# Patient Record
Sex: Female | Born: 1980 | Race: Black or African American | Hispanic: No | Marital: Single | Smoking: Never smoker
Health system: Southern US, Community
[De-identification: ages and names within clinical notes are randomized; demographics above are authoritative.]

## PROBLEM LIST (undated history)

## (undated) DIAGNOSIS — Z789 Other specified health status: Secondary | ICD-10-CM

## (undated) HISTORY — PX: NO PAST SURGERIES: SHX2092

---

## 2014-03-28 ENCOUNTER — Emergency Department: Payer: Self-pay | Admitting: Emergency Medicine

## 2015-02-09 ENCOUNTER — Emergency Department
Admission: EM | Admit: 2015-02-09 | Discharge: 2015-02-09 | Disposition: A | Discharge status: Home / Self Care | Payer: BLUE CROSS/BLUE SHIELD | Attending: Emergency Medicine | Admitting: Emergency Medicine

## 2015-02-09 ENCOUNTER — Emergency Department: Payer: BLUE CROSS/BLUE SHIELD

## 2015-02-09 ENCOUNTER — Encounter: Payer: Self-pay | Admitting: *Deleted

## 2015-02-09 DIAGNOSIS — N939 Abnormal uterine and vaginal bleeding, unspecified: Secondary | ICD-10-CM

## 2015-02-09 DIAGNOSIS — Z3A00 Weeks of gestation of pregnancy not specified: Secondary | ICD-10-CM | POA: Diagnosis not present

## 2015-02-09 DIAGNOSIS — O200 Threatened abortion: Secondary | ICD-10-CM

## 2015-02-09 DIAGNOSIS — E876 Hypokalemia: Secondary | ICD-10-CM

## 2015-02-09 DIAGNOSIS — O99280 Endocrine, nutritional and metabolic diseases complicating pregnancy, unspecified trimester: Secondary | ICD-10-CM | POA: Diagnosis not present

## 2015-02-09 DIAGNOSIS — O9989 Other specified diseases and conditions complicating pregnancy, childbirth and the puerperium: Secondary | ICD-10-CM | POA: Diagnosis present

## 2015-02-09 DIAGNOSIS — E871 Hypo-osmolality and hyponatremia: Secondary | ICD-10-CM

## 2015-02-09 LAB — URINALYSIS COMPLETE WITH MICROSCOPIC (ARMC ONLY)
Bilirubin Urine: NEGATIVE
Glucose, UA: NEGATIVE mg/dL
Hgb urine dipstick: NEGATIVE
Ketones, ur: NEGATIVE mg/dL
Leukocytes, UA: NEGATIVE
Nitrite: NEGATIVE
PH: 6 (ref 5.0–8.0)
PROTEIN: NEGATIVE mg/dL
Specific Gravity, Urine: 1.004 — ABNORMAL LOW (ref 1.005–1.030)

## 2015-02-09 LAB — CBC
HCT: 37.1 % (ref 35.0–47.0)
Hemoglobin: 12 g/dL (ref 12.0–16.0)
MCH: 27.2 pg (ref 26.0–34.0)
MCHC: 32.3 g/dL (ref 32.0–36.0)
MCV: 84 fL (ref 80.0–100.0)
PLATELETS: 251 10*3/uL (ref 150–440)
RBC: 4.42 MIL/uL (ref 3.80–5.20)
RDW: 13 % (ref 11.5–14.5)
WBC: 8.4 10*3/uL (ref 3.6–11.0)

## 2015-02-09 LAB — TYPE AND SCREEN
ABO/RH(D): A POS
Antibody Screen: NEGATIVE

## 2015-02-09 LAB — BASIC METABOLIC PANEL
Anion gap: 6 (ref 5–15)
BUN: 6 mg/dL (ref 6–20)
CO2: 22 mmol/L (ref 22–32)
Calcium: 8.8 mg/dL — ABNORMAL LOW (ref 8.9–10.3)
Chloride: 104 mmol/L (ref 101–111)
Creatinine, Ser: 0.62 mg/dL (ref 0.44–1.00)
GFR calc Af Amer: >60 mL/min (ref >60)
GLUCOSE: 92 mg/dL (ref 65–99)
POTASSIUM: 3.3 mmol/L — AB (ref 3.5–5.1)
Sodium: 132 mmol/L — ABNORMAL LOW (ref 135–145)

## 2015-02-09 LAB — CHLAMYDIA/NGC RT PCR (ARMC ONLY)
Chlamydia Tr: NOT DETECTED
N GONORRHOEAE: NOT DETECTED

## 2015-02-09 LAB — WET PREP, GENITAL
Clue Cells Wet Prep HPF POC: NONE SEEN
SPERM: NONE SEEN
TRICH WET PREP: NONE SEEN
YEAST WET PREP: NONE SEEN

## 2015-02-09 LAB — POCT PREGNANCY, URINE: Preg Test, Ur: POSITIVE — AB

## 2015-02-09 LAB — HCG, QUANTITATIVE, PREGNANCY: hCG, Beta Chain, Quant, S: 733 m[IU]/mL — ABNORMAL HIGH (ref <5)

## 2015-02-09 LAB — ABO/RH: ABO/RH(D): A POS

## 2015-02-09 MED ORDER — SODIUM CHLORIDE 0.9 % IV BOLUS (SEPSIS): 1000.0000 mL | Freq: Once | INTRAVENOUS | Status: DC

## 2015-02-09 MED ORDER — SODIUM CHLORIDE 0.9 % IV BOLUS (SEPSIS)
1000.0000 mL | Freq: Once | INTRAVENOUS | Status: AC
  Administered 2015-02-09: 1000 mL via INTRAVENOUS

## 2015-02-09 MED ORDER — POTASSIUM CHLORIDE CRYS ER 20 MEQ PO TBCR
40.0000 meq | EXTENDED_RELEASE_TABLET | Freq: Once | ORAL | Status: AC
  Administered 2015-02-09: 40 meq via ORAL
  Filled 2015-02-09: qty 2

## 2015-02-09 MED ORDER — IBUPROFEN 800 MG PO TABS
800.0000 mg | ORAL_TABLET | Freq: Once | ORAL | Status: AC
  Administered 2015-02-09: 800 mg via ORAL
  Filled 2015-02-09: qty 1

## 2015-02-09 NOTE — Discharge Instructions (Signed)
May take Tylenol for pain but do not take any NSAID medications including Aleve, Motrin, Advil, or ibuprofen.  Please call the OB/GYN appointment at 8 AM tomorrow morning to schedule an appointment to be seen tomorrow. It is very important for you to be seen before the weekend.  Please return to the emergency department if you develop severe pain, lightheadedness or shortness of breath, fainting, fever, or any other symptoms concerning to you.

## 2015-02-09 NOTE — ED Provider Notes (Signed)
[facility] Emergency Department Provider Note  ____________________________________________  Time seen: Approximately 6:48 PM  I have reviewed the triage vital signs and the nursing notes.   HISTORY  Chief Complaint Abdominal Pain    HPI Sierra Cummings is a 35 y.o. female G2 A1 sent from clinic for positive pregnancy with suprapubic cramping. Patient reports last LMP 01/07/2015. When her next menstrual cycle was due, she had 2 days of very light spotting. Today she developed some lower abdominal cramping and had a positive pregnancy test area did she denies any lightheadedness or shortness of breath, no syncope. No fever. No change in vaginal discharge or urinary symptoms. Pt is sexually active, not using birth control.  PMH: No hx of treatment for STD   History reviewed. No pertinent past medical history.  There are no active problems to display for this patient.   No past surgical history on file.  No current outpatient prescriptions on file.  Allergies Bactrim  History reviewed. No pertinent family history.  Social History Social History  Substance Use Topics  . Smoking status: None  . Smokeless tobacco: None  . Alcohol Use: None    Review of Systems Constitutional: No fever/chills. No lightheadedness or sob. Eyes: No visual changes. ENT: No sore throat. Cardiovascular: Denies chest pain, palpitations. Respiratory: Denies shortness of breath.  No cough. Gastrointestinal: Postive lower abdominal cramping.  No nausea, no vomiting.  No diarrhea.  No constipation. Genitourinary: Negative for dysuria. No change in vaginal discharge. Musculoskeletal: Negative for back pain. Skin: Negative for rash. Neurological: Negative for headaches, focal weakness or numbness.  10-point ROS otherwise negative.  ____________________________________________   PHYSICAL EXAM:  VITAL SIGNS: ED Triage Vitals  Enc Vitals Group     BP 02/09/15 1724  117/74 mmHg     Pulse Rate 02/09/15 1724 92     Resp 02/09/15 1724 18     Temp 02/09/15 1724 98.9 F (37.2 C)     Temp Source 02/09/15 1724 Oral     SpO2 02/09/15 1724 96 %     Weight 02/09/15 1724 129 lb (58.514 kg)     Height 02/09/15 1724  (1.651 m)     Head Cir --      Peak Flow --      Pain Score 02/09/15 1724 4     Pain Loc --      Pain Edu? --      Excl. in GC? --     Constitutional: Alert and oriented. Well appearing and in no acute distress. Answer question appropriately. Eyes: Conjunctivae are normal.  EOMI. scleral icterus. Head: Atraumatic. Nose: No congestion/rhinnorhea. Mouth/Throat: Mucous membranes are moist.  Neck: No stridor.  Supple.   Cardiovascular: Normal rate, regular rhythm. No murmurs, rubs or gallops.  Respiratory: Normal respiratory effort.  No retractions. Lungs CTAB.  No wheezes, rales or ronchi. Gastrointestinal: Soft and nondistended. Minimal tenderness in the suprapubic region. No peritoneal signs, guarding or rebound.. Genitourinary: Normal-appearing external genitalia without lesions. Normal vaginal exam with brown and white thick discharge, normal-appearing cervix, normal vaginal wall tissue. Bimanual exam is negative for CMT, adnexal tenderness to palpation, no palpable masses. Cervix is closed. Musculoskeletal: No LE edema.  Neurologic:  Normal speech and language. No gross focal neurologic deficits are appreciated.  Skin:  Skin is warm, dry and intact. No rash noted. Psychiatric: Mood and affect are normal. Speech and behavior are normal.  Normal judgement.  ____________________________________________   LABS (all labs ordered are listed, but  only abnormal results are displayed)  Labs Reviewed  HCG, QUANTITATIVE, PREGNANCY - Abnormal; Notable for the following:    hCG, Beta Chain, Quant, S 733 (*)    All other components within normal limits  POCT PREGNANCY, URINE - Abnormal; Notable for the following:    Preg Test, Ur POSITIVE (*)     All other components within normal limits  CHLAMYDIA/NGC RT PCR ([facility] ONLY)  WET PREP, GENITAL  CBC  BASIC METABOLIC PANEL  POC URINE PREG, ED  TYPE AND SCREEN   ____________________________________________  EKG  Not Indicated ____________________________________________  RADIOLOGY  No results found.  ____________________________________________   PROCEDURES  Procedure(s) performed: None  Critical Care performed: No ____________________________________________   INITIAL IMPRESSION / ASSESSMENT AND PLAN / ED COURSE  Pertinent labs & imaging results that were available during my care of the patient were reviewed by me and considered in my medical decision making (see chart for details).  35 y.o. G2P1 pregnant female w/ lower abd cramping; has not had bleeding for several weeks.  Plan to eval for suspected ab versus ectopic.  Consider STI, r/o UTI.  R/o anemia.  ----------------------------------------- 8:36 PM on 02/09/2015 -----------------------------------------  Patient continues to be pain-free. Her ultrasound does not show any intrauterine pregnancy, so at this point, she is still possible threatened AB versus ectopic versus early pregnancy. I would do a pelvic exam to send cultures, and then talk to OB/GYN on call's she can have close follow-up. Patient understands her follow-up plan as well as return precautions.  ----------------------------------------- 9:23 PM on 02/09/2015 -----------------------------------------  I have spoken with Dr. Tiburcio Pea, who will see the patient in clinic tomorrow to get her set up for repeat hCG on Saturday. Patient understands return precautions and follow-up instructions, plan discharge.  ____________________________________________  FINAL CLINICAL IMPRESSION(S) / ED DIAGNOSES  Final diagnoses:  Vaginal bleeding      NEW MEDICATIONS STARTED DURING THIS VISIT:  New Prescriptions   No medications on file      Rockne Menghini, MD 02/09/15 2123

## 2015-02-09 NOTE — ED Notes (Signed)
Pt states she had a missed period and then had a positive pregnancy test, now states lower abd cramping, denies any vaginal bleeding or discharge

## 2015-02-10 ENCOUNTER — Emergency Department
Admission: EM | Admit: 2015-02-10 | Discharge: 2015-02-10 | Disposition: A | Discharge status: Home / Self Care | Payer: BLUE CROSS/BLUE SHIELD | Source: Home / Self Care | Attending: Emergency Medicine | Admitting: Emergency Medicine

## 2015-02-10 ENCOUNTER — Emergency Department
Admission: EM | Admit: 2015-02-10 | Discharge: 2015-02-10 | Disposition: A | Discharge status: Home / Self Care | Payer: BLUE CROSS/BLUE SHIELD | Attending: Emergency Medicine | Admitting: Emergency Medicine

## 2015-02-10 ENCOUNTER — Inpatient Hospital Stay: Admit: 2015-02-10 | Payer: BLUE CROSS/BLUE SHIELD | Source: Ambulatory Visit | Admitting: Obstetrics and Gynecology

## 2015-02-10 ENCOUNTER — Encounter: Payer: Self-pay | Admitting: *Deleted

## 2015-02-10 ENCOUNTER — Encounter: Payer: Self-pay | Admitting: Emergency Medicine

## 2015-02-10 ENCOUNTER — Telehealth: Payer: Self-pay | Admitting: Obstetrics and Gynecology

## 2015-02-10 DIAGNOSIS — O209 Hemorrhage in early pregnancy, unspecified: Secondary | ICD-10-CM | POA: Insufficient documentation

## 2015-02-10 DIAGNOSIS — O200 Threatened abortion: Secondary | ICD-10-CM

## 2015-02-10 DIAGNOSIS — Z3A01 Less than 8 weeks gestation of pregnancy: Secondary | ICD-10-CM

## 2015-02-10 LAB — HCG, QUANTITATIVE, PREGNANCY: hCG, Beta Chain, Quant, S: 776 m[IU]/mL — ABNORMAL HIGH

## 2015-02-10 NOTE — ED Notes (Signed)
Pt is [redacted] weeks pregnant, spotting blood today, pt reports slight abdominal cramping

## 2015-02-10 NOTE — Telephone Encounter (Signed)
GYN Telephone Note  I was paged by the ER about Ms. [place] on my way to urgent delivery, due to the fact that she presented because she states that the office said they [facility] couldn't see her until March for ER follow up; she was initially phone triaged by Dr. Tiburcio Pea, who said she could follow up today, January 27th, per the ER notes I said that was unusual and patient can usually be seen in the week. Regardless, the ER stated that she was stable and fine for discharge, and I said for her to call the office on Monday.    Now, on initial viewing of her, even though there isn't data out there for a 21 hour beta, it seems low to me since it only went up about 5% (733 on 1/26 @ 1726 to 776 on 1/27 @ 1412). She is A pos. Her initial ER note on 1/26 states that she presented from a clinic with a +UPT and suprapubic cramping, with brownish discharge on pelvic exam and today she states that she has lower abdominal cramping and vaginal spotting. Prior GC/CT and wet prep testing were negative, along with a u/a and bmp (K slightly low at 3.3)  I called her phone at [phone] and left a VM for her to page me back.  Cornelia Copa MD [facility]  Pager: [phone]

## 2015-02-10 NOTE — ED Provider Notes (Addendum)
[facility] Emergency Department Provider Note     Time seen: ----------------------------------------- 8:08 PM on 02/10/2015 -----------------------------------------    I have reviewed the triage vital signs and the nursing notes.   HISTORY  Chief Complaint No chief complaint on file.    HPI Sierra Cummings is a 35 y.o. female who presents ER with lower abdominal cramps and vaginal spotting. Patient states she's around [redacted] weeks pregnant and symptoms recurred. She was seen here yesterday and was told to follow-up with [facility] when she went there she was given an appointment for March.   History reviewed. No pertinent past medical history.  There are no active problems to display for this patient.   History reviewed. No pertinent past surgical history.  Allergies Bactrim  Social History Social History  Substance Use Topics  . Smoking status: Never Smoker   . Smokeless tobacco: None  . Alcohol Use: No    Review of Systems Constitutional: Negative for fever. Eyes: Negative for visual changes. ENT: Negative for sore throat. Cardiovascular: Negative for chest pain. Respiratory: Negative for shortness of breath. Gastrointestinal: Negative for abdominal pain, vomiting and diarrhea. Genitourinary: Negative for dysuria. Positive for vaginal bleeding and cramping Musculoskeletal: Negative for back pain. Skin: Negative for rash. Neurological: Negative for headaches, focal weakness or numbness.  10-point ROS otherwise negative.  ____________________________________________   PHYSICAL EXAM:  VITAL SIGNS: ED Triage Vitals  Enc Vitals Group     BP 02/10/15 1741 126/70 mmHg     Pulse Rate 02/10/15 1741 100     Resp 02/10/15 1741 14     Temp 02/10/15 1741 98.2 F (36.8 C)     Temp Source 02/10/15 1741 Oral     SpO2 02/10/15 1741 99 %     Weight 02/10/15 1741 129 lb (58.514 kg)     Height 02/10/15 1741  (1.651 m)     Head Cir  --      Peak Flow --      Pain Score 02/10/15 1741 4     Pain Loc --      Pain Edu? --      Excl. in GC? --     Constitutional: Alert and oriented. Well appearing and in no distress. Eyes: Conjunctivae are normal. PERRL. Normal extraocular movements. ENT   Head: Normocephalic and atraumatic.   Nose: No congestion/rhinnorhea.   Mouth/Throat: Mucous membranes are moist.   Neck: No stridor. Cardiovascular: Normal rate, regular rhythm. Normal and symmetric distal pulses are present in all extremities. No murmurs, rubs, or gallops. Respiratory: Normal respiratory effort without tachypnea nor retractions. Breath sounds are clear and equal bilaterally. No wheezes/rales/rhonchi. Gastrointestinal: Soft and nontender. No distention. No abdominal bruits.  Musculoskeletal: Nontender with normal range of motion in all extremities. No joint effusions.  No lower extremity tenderness nor edema. Neurologic:  Normal speech and language. No gross focal neurologic deficits are appreciated. Speech is normal. No gait instability. Skin:  Skin is warm, dry and intact. No rash noted. Psychiatric: Mood and affect are normal. Speech and behavior are normal. Patient exhibits appropriate insight and judgment.  ____________________________________________  ED COURSE:  Pertinent labs & imaging results that were available during my care of the patient were reviewed by me and considered in my medical decision making (see chart for details). Patient is no acute distress, I will discuss with the [facility] on call and recheck her Quant. ____________________________________________    LABS (pertinent positives/negatives) Quantitative beta hCG 776  ____________________________________________  FINAL  ASSESSMENT AND PLAN  Threatened miscarriage  Plan: Patient with labs and imaging as dictated above. HCG is not increasing as expected. Have discussed with [facility] Dr. Vergie Living. Patient is  advised to call the office on Monday and she will be able to be seen sooner. This point she has no significant pain or tenderness and is stable for outpatient follow-up.   Emily Filbert, MD Patient subsequent he stated she does not want to see [facility] because she had a bad experience there today. I will give her alternative follow-up information.  Emily Filbert, MD 02/10/15 2011  Emily Filbert, MD 02/10/15 Elisha Ponder

## 2015-02-10 NOTE — Telephone Encounter (Signed)
GYN Telephone Note  Patient called me back and situation d/w her and she is amenable for direct admission.   Cornelia Copa MD [facility]  Pager: [phone]

## 2015-02-10 NOTE — ED Notes (Signed)
Pt refuses blood to be taken

## 2015-02-10 NOTE — Discharge Instructions (Signed)

## 2015-02-10 NOTE — ED Notes (Signed)
Pt was seen for lower abdominal cramps yesterday here.  Had spotting yesterday then it stopped and came back to ED today because she started spotting again.  Still has some mild cramping.

## 2015-02-11 ENCOUNTER — Telehealth: Payer: Self-pay | Admitting: Obstetrics and Gynecology

## 2015-02-11 ENCOUNTER — Emergency Department
Admission: EM | Admit: 2015-02-11 | Discharge: 2015-02-11 | Disposition: A | Discharge status: Home / Self Care | Payer: BLUE CROSS/BLUE SHIELD | Attending: Emergency Medicine | Admitting: Emergency Medicine

## 2015-02-11 ENCOUNTER — Encounter: Payer: Self-pay | Admitting: Emergency Medicine

## 2015-02-11 DIAGNOSIS — O209 Hemorrhage in early pregnancy, unspecified: Secondary | ICD-10-CM | POA: Diagnosis not present

## 2015-02-11 DIAGNOSIS — Z3A01 Less than 8 weeks gestation of pregnancy: Secondary | ICD-10-CM | POA: Diagnosis not present

## 2015-02-11 DIAGNOSIS — O4690 Antepartum hemorrhage, unspecified, unspecified trimester: Secondary | ICD-10-CM | POA: Diagnosis present

## 2015-02-11 HISTORY — DX: Other specified health status: Z78.9

## 2015-02-11 LAB — CBC WITH DIFFERENTIAL/PLATELET
BASOS PCT: 1 %
Basophils Absolute: 0 10*3/uL (ref 0–0.1)
EOS ABS: 0.1 10*3/uL (ref 0–0.7)
EOS PCT: 2 %
HCT: 38.1 % (ref 35.0–47.0)
Hemoglobin: 12.4 g/dL (ref 12.0–16.0)
LYMPHS ABS: 1.9 10*3/uL (ref 1.0–3.6)
Lymphocytes Relative: 29 %
MCH: 26.8 pg (ref 26.0–34.0)
MCHC: 32.5 g/dL (ref 32.0–36.0)
MCV: 82.5 fL (ref 80.0–100.0)
Monocytes Absolute: 0.6 10*3/uL (ref 0.2–0.9)
Monocytes Relative: 9 %
Neutro Abs: 3.8 10*3/uL (ref 1.4–6.5)
Neutrophils Relative %: 59 %
PLATELETS: 270 10*3/uL (ref 150–440)
RBC: 4.62 MIL/uL (ref 3.80–5.20)
RDW: 13.4 % (ref 11.5–14.5)
WBC: 6.4 10*3/uL (ref 3.6–11.0)

## 2015-02-11 LAB — HCG, QUANTITATIVE, PREGNANCY: hCG, Beta Chain, Quant, S: 595 m[IU]/mL — ABNORMAL HIGH (ref <5)

## 2015-02-11 NOTE — ED Notes (Signed)
Had vaginal spotting on jan, 20, jan 26 and 27th. Has stopped. States was always light and never enough to wear a pad. Light abdominal cramping.

## 2015-02-11 NOTE — Telephone Encounter (Signed)
GYN Telephone Note  Patient called and VM left regarding how come she hasn't presented for direct admission and if she had any questions or concerns. Pt told to call the office to get the on call pager, and I will left my relief at [phone] (Dr. Jean Rosenthal) know about her situation.  Cornelia Copa MD [facility]  Pager: [phone]

## 2015-02-11 NOTE — ED Notes (Signed)
Pt is [redacted] weeks pregnant and has had 3 episodes of light spotting in the past. Today she had a very small amount that she said "must have happened overnight". Pt states she is currently not spotting.

## 2015-02-11 NOTE — Consult Note (Signed)
GYNECOLOGY CONSULT NOTE  GYN Consultation  Rajean Desantiago [phone] 02/11/2015 4:31 PM    Reason for Consultation:   Taraji Mungo is a 35 y.o. G1P0 female seen at the request of the Emergency Department for evaluation of vaginal bleeding in the setting of a positive pregnancy test.    History of Present Ilness:   The patient presents today to the ER in follow up from a telephone discussion she had with Dr. Vergie Living of [facility] yesterday evening.  Her story is as follows, her period was supposed to be around 02/03/15.  However, she missed her period. On Monday of this past week (1/24) she took a home pregnancy test which was faintly positive. On Tuesday she had a positive result. She presented to the walk-in clinic at [facility] and there had a positive pregnancy test.  She states that she was having no symptoms at that time.  But, was told to go to the ER to get an ultrasound.  The ED note from that date states that she was having some suprapubic cramping and a report of vaginal spotting.  She states that these symptoms did later develop.  She had an ultrasound that was essentially normal.  She had a quant hCG that was 733.  The ER MD that day discussed this with Dr. Tiburcio Pea who recommended she follow up the following day in clinic with him (Friday 1/27).  She was discharged.  She states that she was unable to get an appointment at [facility] and presented again yesterday for further evaluation when she began having more severe cramping.  She states this cramping was short lived, but she did have heavier spotting.  Her quant in the ER at that time was 776.  Dr Vergie Living from [facility] was paged, but was heading into a delivery and given that the patient was stable, he recommended the patient follow up in clinic on Monday to allow for enough time to detect an appropriate rise in her quantitative hCG.  Later, when he was able to review her chart, he saw that over about one day her quant had risen only a  small amount.  He called her at home and asked her to come to the hospital for direct admission at 5 am for further management.  According to her today, she states that she misunderstood Dr. Vergie Living to mean 5pm today, 02/11/15. This morning when Dr. Vergie Living noticed that she had not presented, called the patient and left her a voicemail to inquire as to why she had not shown up this morning. Once the patient heard this message she came to the hospital ED for admission.  She states that she has no pain or cramping now and has had no bleeding today, other than a small amount of dark discharge she noticed this morning on her panty liner she wore overnight.    Past Medical History  Diagnosis Date  . Medical history non-contributory    Past Surgical History  Procedure Laterality Date  . No past surgeries     Allergies  Allergen Reactions  . Bactrim [Sulfamethoxazole-Trimethoprim] Itching and Other (See Comments)    Patient states she gets red and little pimples appear.   Home Medications:  None   Obstetric History: She is a G1P0.    Gynecologic History:   Menarche age: 37 Patient's last menstrual period was 01/07/2015 (lmp unknown). Cycle Hx: flow is moderate and regular every 28 days without intermenstrual spotting Last Pap: 2-3, years ago, returned normal She denies  a history of abnormal pap smears  She denies a history of STDs. Contraception: None currently   Social History:  She  reports that she has never smoked. She does not have any smokeless tobacco history on file. She reports that she drinks alcohol. She reports that she does not use illicit drugs.  She is a Financial controller.  Family History:  family history is not on file.   Review of Systems:  Negative x 10 systems reviewed except as noted in the HPI.    Objective   BP 114/64 mmHg  Pulse 85  Temp(Src) 98.3 F (36.8 C) (Oral)  Resp 16  Ht  (1.651 m)  Wt 58.514 kg (129 lb)  BMI 21.47 kg/m2  SpO2 99%  LMP  01/07/2015 (LMP Unknown) Physical Exam  General:  She is a well appearing female in no distress.  HEENT:  Normocephalic, atraumatic.   Neck:  supple, no lymphadenopathy Cardiac:  RRR, no M/G/R Pulmonary:  Clear to auscultation bilaterally. No wheezes, rales, rhonchi.   Abdomen:  Soft, nontender, non-distended, +BS, no rebound, no guarding  Pelvic:  Deferred. Extremities:  Non-tender, symmetric no edema bilaterally.   Neurologic:  Alert & oriented x 3.  Appropriate, conversant.    Laboratory Results:   Recent Labs     02/09/15  1726  02/10/15  1412  02/11/15  1237  WBC  8.4   --   6.4  HGB  12.0   --   12.4  HCT  37.1   --   38.1  PLT  251   --   270  CREATININE  0.62   --    --   K  3.3*   --    --   NA  132*   --    --   HCGBETAQNT  733*  776*  595*   Imaging Results: Pelvic Ultrasound Report 02/09/2015 CLINICAL DATA: Lower abdominal pain and light vaginal bleeding. Estimated gestational age by last menstrual period equals for weeks 5 days. beta HCG equals 733.  EXAM: OBSTETRIC <14 WK [place] AND TRANSVAGINAL OB US  TECHNIQUE: Both transabdominal and transvaginal ultrasound examinations were performed for complete evaluation of the gestation as well as the maternal uterus, adnexal regions, and pelvic cul-de-sac. Transvaginal technique was performed to assess early pregnancy.  COMPARISON: None.  FINDINGS: Intrauterine gestational sac: None  Yolk sac: No  Embryo: No  Maternal uterus/adnexae: RIGHT ovaries normal size 2.3 x 1.2 by 1.8 cm. Normal small follicles. Small follicles within the LEFT ovary which measures 3.0 x 2.8 x 2.3 cm. Hypoechoic region in the LEFT ovary measuring 1.5 cm could represent a corpus luteal cyst.  No free-fluid.  IMPRESSION: No intrauterine gestational sac, yolk sac, or fetal pole identified. Differential considerations include intrauterine pregnancy too early to be sonographically visualized, missed abortion, or  ectopic pregnancy. Followup ultrasound is recommended in 10-14 days for further evaluation.  Assessment & Recommendations  Aanyah Gazzola is a 35 y.o. G1P0 female with a recent history of vaginal bleeding and a dropping quantitative hCG level.  She has no current symptoms.  Discussed, in great detail, the range of possible scenarios.  We specifically discussed that, no matter what the case, this is not a normal pregnancy.  We discussed the possibility of an ectopic pregnancy and what that means and the risks of an ectopic pregnancy.  We discussed that this could be a spontaneous abortion that is evolving and what she might expect in that circumstance. We discussed  management strategies, including, what I recommended to be the most conservative and the one that would provide her with definite answers. This would be admission for a D&C with frozen pathology sent for presence of chorionic villi.  If present, the pregnancy was in the uterus and she would have resolved the pregnancy by virtue of the D&C. If negative, the pregnancy would be ectopic and we would then know that and treat her with methotrexate.  As an alternative to surgery, we discussed following her as an outpatient with her returning to clinic on Monday. Since she had issues before, I offered to leave a message with our practice manager to call her personally to arrange Monday follow up.  We also discussed having her come in for interim labs tomorrow (no 48 hour window needed given we know this pregnancy is abnormal). If the quant hCG lab rises, then we will be much more suspicious of ectopic and can treat in that case.  A middle of the road alternative was offered which would be to admit her overnight for symptom observation and recheck of labs tomorrow with disposition based on labs. After much discussion, she was against surgery at this point. She elected to return tomorrow for an outpatient lab draw.  I felt like this would be reasonable given  that she is a reliable patient. She has shown up to the ED for three consecutive days.  She was given warning signs of severe abdominal and pelvic pain and with heavy bleeding. She is return to the ER should those symptoms occurs.  She voiced understanding and agreement with that plan.  Recommendations:  1.  Follow for outpatient quant HCG level tomorrow 2.  Blood type is A+.  Conard Novak, MD 02/11/2015 4:31 PM

## 2015-02-11 NOTE — ED Provider Notes (Signed)
Time Seen: Approximately ----------------------------------------- 12:12 PM on 02/11/2015 -----------------------------------------    I have reviewed the triage notes  Chief Complaint: Vaginal Bleeding   History of Present Illness: Sierra Cummings is a 35 y.o. female *who is currently gravida 2 para 0 with one previous miscarriage. Patient's been in our emergency department over the last 3 days for intermittent spotty vaginal bleeding with lower middle abdominal cramping. She states she had a appointment with [facility] with Dr. Vergie Living spell due to schedule, etc. she ended up being referred here to emergency department for repeat quantitative pregnancy evaluation. Her quantitative pregnancy is not advanced as expected. She denies any current significant abdominal pain and hasn't had any vaginal bleeding since early this morning. She denies any feelings of lightheadedness, shortness of breath, fatigue, etc. She denies any heavy vaginal bleeding such as 1 full pad per hour for 4 consecutive hours. Patient's had an ultrasound and pelvic evaluation previously on January 26. Ultrasound did not show any evidence at that time for an uterine pregnancy.   History reviewed. No pertinent past medical history.  There are no active problems to display for this patient.   History reviewed. No pertinent past surgical history.  History reviewed. No pertinent past surgical history.  No current outpatient prescriptions on file.  Allergies:  Bactrim  Family History: No family history on file.  Social History: Social History  Substance Use Topics  . Smoking status: Never Smoker   . Smokeless tobacco: None  . Alcohol Use: Yes     Review of Systems:   10 point review of systems was performed and was otherwise negative:  Constitutional: No fever Eyes: No visual disturbances ENT: No sore throat, ear pain Cardiac: No chest pain Respiratory: No shortness of breath, wheezing, or  stridor Abdomen: No abdominal pain, no vomiting, No diarrhea Endocrine: No weight loss, No night sweats Extremities: No peripheral edema, cyanosis Skin: No rashes, easy bruising Neurologic: No focal weakness, trouble with speech or swollowing Urologic: No dysuria, Hematuria, or urinary frequency   Physical Exam:  ED Triage Vitals  Enc Vitals Group     BP 02/11/15 1057 120/72 mmHg     Pulse Rate 02/11/15 1057 90     Resp 02/11/15 1057 18     Temp 02/11/15 1057 98.3 F (36.8 C)     Temp Source 02/11/15 1057 Oral     SpO2 02/11/15 1057 97 %     Weight 02/11/15 1057 129 lb (58.514 kg)     Height 02/11/15 1057  (1.651 m)     Head Cir --      Peak Flow --      Pain Score 02/11/15 1058 3     Pain Loc --      Pain Edu? --      Excl. in GC? --     General: Awake , Alert , and Oriented times 3; GCS 15 Head: Normal cephalic , atraumatic Eyes: Pupils equal , round, reactive to light Nose/Throat: No nasal drainage, patent upper airway without erythema or exudate.  Neck: Supple, Full range of motion, No anterior adenopathy or palpable thyroid masses Lungs: Clear to ascultation without wheezes , rhonchi, or rales Heart: Regular rate, regular rhythm without murmurs , gallops , or rubs Abdomen: Soft, non tender without rebound, guarding , or rigidity; bowel sounds positive and symmetric in all 4 quadrants. No organomegaly .        Extremities: 2 plus symmetric pulses. No edema, clubbing or cyanosis  Neurologic: normal ambulation, Motor symmetric without deficits, sensory intact Skin: warm, dry, no rashes   Labs:   All laboratory work was reviewed including any pertinent negatives or positives listed below:  Labs Reviewed  CBC WITH DIFFERENTIAL/PLATELET  HCG, QUANTITATIVE, PREGNANCY   review laboratory work shows a slight decline in her serum quantitative pregnancy from yesterday.    ED Course: * Patient's case was reviewed with Dr. Jean Rosenthal who was on call for [facility]. He is agreed to examine the patient and reviewed the laboratory work and determine a plan for management. Patient does not appear to be any hemodynamic distress and has no significant abdominal pain.    Assessment:  First trimester pregnancy (miscarriage versus ectopic pregnancy)      Plan:  Outpatient management Patient was advised to return immediately if condition worsens. Patient was advised to follow up with their primary care physician or other specialized physicians involved in their outpatient care            Jennye Moccasin, MD 02/11/15 1513

## 2015-02-11 NOTE — Discharge Instructions (Signed)
Vaginal Bleeding During Pregnancy, First Trimester °A small amount of bleeding (spotting) from the vagina is relatively common in early pregnancy. It usually stops on its own. Various things may cause bleeding or spotting in early pregnancy. Some bleeding may be related to the pregnancy, and some may not. In most cases, the bleeding is normal and is not a problem. However, bleeding can also be a sign of something serious. Be sure to tell your health care provider about any vaginal bleeding right away. °Some possible causes of vaginal bleeding during the first trimester include: °· Infection or inflammation of the cervix. °· Growths (polyps) on the cervix. °· Miscarriage or threatened miscarriage. °· Pregnancy tissue has developed outside of the uterus and in a fallopian tube (tubal pregnancy). °· Tiny cysts have developed in the uterus instead of pregnancy tissue (molar pregnancy). °HOME CARE INSTRUCTIONS  °Watch your condition for any changes. The following actions may help to lessen any discomfort you are feeling: °· Follow your health care provider's instructions for limiting your activity. If your health care provider orders bed rest, you may need to stay in bed and only get up to use the bathroom. However, your health care provider may allow you to continue light activity. °· If needed, make plans for someone to help with your regular activities and responsibilities while you are on bed rest. °· Keep track of the number of pads you use each day, how often you change pads, and how soaked (saturated) they are. Write this down. °· Do not use tampons. Do not douche. °· Do not have sexual intercourse or orgasms until approved by your health care provider. °· If you pass any tissue from your vagina, save the tissue so you can show it to your health care provider. °· Only take over-the-counter or prescription medicines as directed by your health care provider. °· Do not take aspirin because it can make you  bleed. °· Keep all follow-up appointments as directed by your health care provider. °SEEK MEDICAL CARE IF: °· You have any vaginal bleeding during any part of your pregnancy. °· You have cramps or labor pains. °· You have a fever, not controlled by medicine. °SEEK IMMEDIATE MEDICAL CARE IF:  °· You have severe cramps in your back or belly (abdomen). °· You pass large clots or tissue from your vagina. °· Your bleeding increases. °· You feel light-headed or weak, or you have fainting episodes. °· You have chills. °· You are leaking fluid or have a gush of fluid from your vagina. °· You pass out while having a bowel movement. °MAKE SURE YOU: °· Understand these instructions. °· Will watch your condition. °· Will get help right away if you are not doing well or get worse. °  °This information is not intended to replace advice given to you by your health care provider. Make sure you discuss any questions you have with your health care provider. °  °Document Released: 10/10/2004 Document Revised: 01/05/2013 Document Reviewed: 09/07/2012 °Elsevier Interactive Patient Education ©2016 [employer] ° °Please return immediately if condition worsens. Please contact her primary physician or the physician you were given for referral. If you have any specialist physicians involved in her treatment and plan please also contact them. Thank you for using [facility]. ° °

## 2015-02-11 NOTE — ED Notes (Signed)
Pt verbalized understanding of discharge instructions. NAD at this time. 

## 2015-02-12 ENCOUNTER — Other Ambulatory Visit
Admission: AD | Admit: 2015-02-12 | Discharge: 2015-02-12 | Disposition: A | Discharge status: Home / Self Care | Payer: BLUE CROSS/BLUE SHIELD | Source: Ambulatory Visit | Attending: Obstetrics and Gynecology | Admitting: Obstetrics and Gynecology

## 2015-02-12 ENCOUNTER — Other Ambulatory Visit: Admit: 2015-02-12 | Payer: Self-pay | Source: Other Acute Inpatient Hospital | Admitting: Obstetrics and Gynecology

## 2015-02-12 DIAGNOSIS — Z3200 Encounter for pregnancy test, result unknown: Secondary | ICD-10-CM | POA: Insufficient documentation

## 2015-02-12 LAB — HCG, QUANTITATIVE, PREGNANCY: hCG, Beta Chain, Quant, S: 445 m[IU]/mL — ABNORMAL HIGH (ref <5)

## 2015-02-12 NOTE — Progress Notes (Signed)
Spoke with patient today.  She went to the lab today to have her quant hCG drawn.   Her results are as follows.  Recent Labs     02/10/15  1412  02/11/15  1237  02/12/15  1240  HCGBETAQNT  776*  595*  445*    Patient has only mild spotting, no cramping, or abdominal pain currently.  She is feeling well today. Discussed that continued observation will be important and that dropping hCG levels does not rule out risk of ectopic pregnancy.  Will continue to follow.  Will see as an outpatient during the upcoming week.  The same precautions given yesterday apply. All questions answered. I will have my office contact her tomorrow to arrange an appointment.  Thomasene Mohair, MD 02/12/2015 4:35 PM

## 2015-02-16 ENCOUNTER — Encounter: Payer: Self-pay | Admitting: *Deleted

## 2015-02-16 ENCOUNTER — Emergency Department
Admission: EM | Admit: 2015-02-16 | Discharge: 2015-02-17 | Disposition: A | Discharge status: Home / Self Care | Payer: BLUE CROSS/BLUE SHIELD | Attending: Student | Admitting: Student

## 2015-02-16 DIAGNOSIS — O9989 Other specified diseases and conditions complicating pregnancy, childbirth and the puerperium: Secondary | ICD-10-CM | POA: Diagnosis present

## 2015-02-16 DIAGNOSIS — Z3A00 Weeks of gestation of pregnancy not specified: Secondary | ICD-10-CM | POA: Diagnosis not present

## 2015-02-16 DIAGNOSIS — O039 Complete or unspecified spontaneous abortion without complication: Secondary | ICD-10-CM | POA: Diagnosis not present

## 2015-02-16 DIAGNOSIS — O210 Mild hyperemesis gravidarum: Secondary | ICD-10-CM | POA: Diagnosis not present

## 2015-02-16 LAB — CBC
HCT: 37.9 % (ref 35.0–47.0)
HEMOGLOBIN: 12.2 g/dL (ref 12.0–16.0)
MCH: 27.3 pg (ref 26.0–34.0)
MCHC: 32.2 g/dL (ref 32.0–36.0)
MCV: 84.6 fL (ref 80.0–100.0)
PLATELETS: 239 10*3/uL (ref 150–440)
RBC: 4.48 MIL/uL (ref 3.80–5.20)
RDW: 13.1 % (ref 11.5–14.5)
WBC: 8.7 10*3/uL (ref 3.6–11.0)

## 2015-02-16 LAB — COMPREHENSIVE METABOLIC PANEL
ALK PHOS: 45 U/L (ref 38–126)
ALT: 9 U/L — AB (ref 14–54)
ANION GAP: 11 (ref 5–15)
AST: 22 U/L (ref 15–41)
Albumin: 4.4 g/dL (ref 3.5–5.0)
BUN: 7 mg/dL (ref 6–20)
CALCIUM: 9.4 mg/dL (ref 8.9–10.3)
CO2: 18 mmol/L — ABNORMAL LOW (ref 22–32)
CREATININE: 0.67 mg/dL (ref 0.44–1.00)
Chloride: 108 mmol/L (ref 101–111)
Glucose, Bld: 111 mg/dL — ABNORMAL HIGH (ref 65–99)
Potassium: 4.1 mmol/L (ref 3.5–5.1)
SODIUM: 137 mmol/L (ref 135–145)
Total Bilirubin: 0.5 mg/dL (ref 0.3–1.2)
Total Protein: 7.6 g/dL (ref 6.5–8.1)

## 2015-02-16 LAB — HCG, QUANTITATIVE, PREGNANCY: hCG, Beta Chain, Quant, S: 84 m[IU]/mL — ABNORMAL HIGH (ref <5)

## 2015-02-16 LAB — LIPASE, BLOOD: LIPASE: 23 U/L (ref 11–51)

## 2015-02-16 MED ORDER — MORPHINE SULFATE (PF) 4 MG/ML IV SOLN
4.0000 mg | Freq: Once | INTRAVENOUS | Status: AC
  Administered 2015-02-16: 4 mg via INTRAVENOUS
  Filled 2015-02-16: qty 1

## 2015-02-16 MED ORDER — ONDANSETRON HCL 4 MG/2ML IJ SOLN
4.0000 mg | Freq: Once | INTRAMUSCULAR | Status: AC
  Administered 2015-02-16: 4 mg via INTRAVENOUS
  Filled 2015-02-16: qty 2

## 2015-02-16 MED ORDER — ONDANSETRON HCL 4 MG/2ML IJ SOLN: 4.0000 mg | Freq: Once | INTRAMUSCULAR | Status: DC

## 2015-02-16 MED ORDER — SODIUM CHLORIDE 0.9 % IV BOLUS (SEPSIS)
1000.0000 mL | Freq: Once | INTRAVENOUS | Status: AC
  Administered 2015-02-16: 1000 mL via INTRAVENOUS

## 2015-02-16 MED ORDER — MORPHINE SULFATE (PF) 4 MG/ML IV SOLN: 4.0000 mg | Freq: Once | INTRAVENOUS | Status: DC

## 2015-02-16 MED ORDER — SODIUM CHLORIDE 0.9 % IV BOLUS (SEPSIS): 1000.0000 mL | Freq: Once | INTRAVENOUS | Status: DC

## 2015-02-16 NOTE — ED Notes (Signed)
Brought in via ems with abd pain and some vaginal bleeding   Positive preg test

## 2015-02-16 NOTE — ED Provider Notes (Signed)
[facility] Emergency Department Provider Note  ____________________________________________  Time seen: Approximately 8:39 PM  I have reviewed the triage vital signs and the nursing notes.   HISTORY  Chief Complaint Abdominal Cramping    HPI Sierra Cummings is a 35 y.o. female G1 P0 and first trimester pregnancy presenting for evaluation of lower abdominal cramping today, severe, no modifying factors, constant since onset. The patient reports that she was seen by her OB/GYN today because she has been having vaginal bleeding over the past week. She was seen in this emergency department every day from January 26 to the 29th with declining beta hCG. She also had an ultrasound on January 26 which showed no intrauterine pregnancy. She was seen by Dr. Jean Rosenthal today, her beta hCG had decreased significantly and he thought that she was having a miscarriage. She reports that after she left the office she developed severe abdominal cramping. She is not taken any over-the-counter medications to help with the pain. She has been nauseated and had one episode of vomiting. No diarrhea, no chills. No chest pain or difficulty breathing. She has only had enough vaginal bleeding to fill one panty liner.   History reviewed. No pertinent past medical history.  There are no active problems to display for this patient.   No past surgical history on file.  No current outpatient prescriptions on file.  Allergies Bactrim  History reviewed. No pertinent family history.  Social History Social History  Substance Use Topics  . Smoking status: None  . Smokeless tobacco: None  . Alcohol Use: None    Review of Systems Constitutional: No fever/chills Eyes: No visual changes. ENT: No sore throat. Cardiovascular: Denies chest pain. Respiratory: Denies shortness of breath. Gastrointestinal: + abdominal pain.  + nausea, + vomiting.  No diarrhea.  No constipation. Genitourinary:  Negative for dysuria. Musculoskeletal: Negative for back pain. Skin: Negative for rash. Neurological: Negative for headaches, focal weakness or numbness.  10-point ROS otherwise negative.  ____________________________________________   PHYSICAL EXAM:  VITAL SIGNS: ED Triage Vitals  Enc Vitals Group     BP 02/16/15 1849 105/60 mmHg     Pulse Rate 02/16/15 1849 75     Resp 02/16/15 1849 18     Temp 02/16/15 1849 98.6 F (37 C)     Temp Source 02/16/15 1849 Oral     SpO2 02/16/15 1849 100 %     Weight 02/16/15 1849 135 lb (61.236 kg)     Height 02/16/15 1849  (1.651 m)     Head Cir --      Peak Flow --      Pain Score 02/16/15 1856 10     Pain Loc --      Pain Edu? --      Excl. in GC? --     Constitutional: Alert and oriented. In mild distress due to pain. Eyes: Conjunctivae are normal. PERRL. EOMI. Head: Atraumatic. Nose: No congestion/rhinnorhea. Mouth/Throat: Mucous membranes are moist.  Oropharynx non-erythematous. Neck: No stridor.  Cardiovascular: Normal rate, regular rhythm. Grossly normal heart sounds.  Good peripheral circulation. Respiratory: Normal respiratory effort.  No retractions. Lungs CTAB. Gastrointestinal: Soft with moderate tenderness to palpation in the suprapubic region. No CVA tenderness. Pelvic: Scant amount of dark blood in the vaginal vault,  closed os Musculoskeletal: No lower extremity tenderness nor edema.  No joint effusions. Neurologic:  Normal speech and language. No gross focal neurologic deficits are appreciated.  Skin:  Skin is warm, dry and intact. No  rash noted. Psychiatric: Mood and affect are normal. Speech and behavior are normal.  ____________________________________________   LABS (all labs ordered are listed, but only abnormal results are displayed)  Labs Reviewed  HCG, QUANTITATIVE, PREGNANCY - Abnormal; Notable for the following:    hCG, Beta Chain, Quant, S 84 (*)    All other components within normal limits   COMPREHENSIVE METABOLIC PANEL - Abnormal; Notable for the following:    CO2 18 (*)    Glucose, Bld 111 (*)    ALT 9 (*)    All other components within normal limits  CBC  LIPASE, BLOOD   ____________________________________________  EKG  none ____________________________________________  RADIOLOGY  [place] from 02/08/14 IMPRESSION: No intrauterine gestational sac, yolk sac, or fetal pole identified. Differential considerations include intrauterine pregnancy too early to be sonographically visualized, missed abortion, or ectopic pregnancy. Followup ultrasound is recommended in 10-14 days for further evaluation.  ____________________________________________   PROCEDURES  Procedure(s) performed: None  Critical Care performed: No  ____________________________________________   INITIAL IMPRESSION / ASSESSMENT AND PLAN / ED COURSE  Pertinent labs & imaging results that were available during my care of the patient were reviewed by me and considered in my medical decision making (see chart for details).  Sierra Cummings is a 35 y.o. female G1 P0 and first trimester pregnancy presenting for evaluation of lower abdominal cramping today, severe, no modifying factors, constant since onset. On exam, she is in mild distress due to pain. She does have moderate superpubic tenderness without rebound or guarding. Vital signs are stable, she is afebrile. Her beta hCG is only 84 today and appears to be declining appropriately as it was 445 on 02/11/2014. CBC is unremarkable as is CMP and lipase. I suspect she is having pain secondary to miscarriage. She has a very small amount of blood in the vaginal vault. We'll treat her pain and discussed the case with OB/GYN.  ----------------------------------------- 12:08 AM on 02/17/2015 -----------------------------------------  Patient with significant improvement in her pain at this time. I discussed the case with Dr. Elesa Massed, on-call for Dr. Jean Rosenthal.  She agrees that the patient's beta hCG is declining appropriately and this likely represents continued miscarriage. She recommends discharge with expectant management to include Cytotec if the patient so chooses. I discussed Cytotec with the patient, she is not sure that she wants to use it but is requesting the prescriptions and she will make a decision tomorrow. We discussed meticulous return precautions and she is comfortable with the discharge plan. She'll follow-up with Dr. Jean Rosenthal early next week. DC home. ____________________________________________   FINAL CLINICAL IMPRESSION(S) / ED DIAGNOSES  Final diagnoses:  Spontaneous abortion      Gayla Doss, MD 02/17/15 [phone]

## 2015-02-16 NOTE — ED Notes (Addendum)
States "unbearable" abd cramping and vaginal bleeding, pt was seen in ER this week and told she might be having a miscarriage or an eptopic pregnancy

## 2015-02-16 NOTE — ED Notes (Signed)
Patient states she saw her OBGYN earlier today where they completed an [place].  Patient states that the OB believes she has had a miscarriage because they did not see anything on the [place].

## 2015-02-17 ENCOUNTER — Encounter: Payer: Self-pay | Admitting: *Deleted

## 2015-02-17 MED ORDER — OXYCODONE HCL 5 MG PO TABS: 5.0000 mg | ORAL_TABLET | Freq: Four times a day (QID) | ORAL | Status: DC | PRN

## 2015-02-17 MED ORDER — MISOPROSTOL 200 MCG PO TABS: 800.0000 ug | ORAL_TABLET | Freq: Every day | ORAL | Status: DC

## 2017-04-04 ENCOUNTER — Other Ambulatory Visit: Payer: Self-pay

## 2017-04-04 ENCOUNTER — Emergency Department
Admission: EM | Admit: 2017-04-04 | Discharge: 2017-04-04 | Disposition: A | Discharge status: Home / Self Care | Payer: BLUE CROSS/BLUE SHIELD | Attending: Emergency Medicine | Admitting: Emergency Medicine

## 2017-04-04 ENCOUNTER — Emergency Department: Payer: BLUE CROSS/BLUE SHIELD

## 2017-04-04 ENCOUNTER — Encounter: Payer: Self-pay | Admitting: Emergency Medicine

## 2017-04-04 DIAGNOSIS — Y92410 Unspecified street and highway as the place of occurrence of the external cause: Secondary | ICD-10-CM | POA: Insufficient documentation

## 2017-04-04 DIAGNOSIS — Y999 Unspecified external cause status: Secondary | ICD-10-CM | POA: Insufficient documentation

## 2017-04-04 DIAGNOSIS — Y9389 Activity, other specified: Secondary | ICD-10-CM | POA: Diagnosis not present

## 2017-04-04 DIAGNOSIS — S199XXA Unspecified injury of neck, initial encounter: Secondary | ICD-10-CM | POA: Diagnosis present

## 2017-04-04 DIAGNOSIS — S161XXA Strain of muscle, fascia and tendon at neck level, initial encounter: Secondary | ICD-10-CM | POA: Diagnosis not present

## 2017-04-04 MED ORDER — TRAMADOL HCL 50 MG PO TABS: 50.0000 mg | ORAL_TABLET | Freq: Four times a day (QID) | ORAL | 0 refills | Status: DC | PRN

## 2017-04-04 MED ORDER — TRAMADOL HCL 50 MG PO TABS
50.0000 mg | ORAL_TABLET | Freq: Once | ORAL | Status: AC
  Administered 2017-04-04: 50 mg via ORAL
  Filled 2017-04-04: qty 1

## 2017-04-04 MED ORDER — NAPROXEN 500 MG PO TABS: 500.0000 mg | ORAL_TABLET | Freq: Two times a day (BID) | ORAL | 0 refills | Status: AC

## 2017-04-04 MED ORDER — METHOCARBAMOL 500 MG PO TABS: 500.0000 mg | ORAL_TABLET | Freq: Four times a day (QID) | ORAL | 0 refills | Status: DC

## 2017-04-04 NOTE — Discharge Instructions (Addendum)
[facility] if any continued problems.  Use ice or moist heat to your neck and muscles as needed for discomfort.  Begin taking medication only as directed.  Naproxen 500 mg twice daily with food, tramadol 1 every 6 hours as needed for pain, and methocarbamol 500 mg 1 tablet 4 times daily as needed for muscle spasms.  Do not take the tramadol and methocarbamol while driving or operating machinery as this medication could cause drowsiness and increase your risk for injury.
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
## 2021-03-07 ENCOUNTER — Emergency Department
Admission: EM | Admit: 2021-03-07 | Discharge: 2021-03-07 | Disposition: A | Discharge status: Home / Self Care | Payer: BC Managed Care – PPO | Attending: Emergency Medicine | Admitting: Emergency Medicine

## 2021-03-07 ENCOUNTER — Emergency Department: Payer: BC Managed Care – PPO

## 2021-03-07 ENCOUNTER — Other Ambulatory Visit: Payer: Self-pay

## 2021-03-07 DIAGNOSIS — R0989 Other specified symptoms and signs involving the circulatory and respiratory systems: Secondary | ICD-10-CM | POA: Insufficient documentation

## 2021-03-07 DIAGNOSIS — Z20822 Contact with and (suspected) exposure to covid-19: Secondary | ICD-10-CM | POA: Insufficient documentation

## 2021-03-07 DIAGNOSIS — R102 Pelvic and perineal pain: Secondary | ICD-10-CM | POA: Insufficient documentation

## 2021-03-07 DIAGNOSIS — J3489 Other specified disorders of nose and nasal sinuses: Secondary | ICD-10-CM | POA: Insufficient documentation

## 2021-03-07 DIAGNOSIS — R1031 Right lower quadrant pain: Secondary | ICD-10-CM | POA: Diagnosis present

## 2021-03-07 DIAGNOSIS — R059 Cough, unspecified: Secondary | ICD-10-CM | POA: Insufficient documentation

## 2021-03-07 LAB — COMPREHENSIVE METABOLIC PANEL
ALT: 10 U/L (ref 0–44)
AST: 17 U/L (ref 15–41)
Albumin: 4 g/dL (ref 3.5–5.0)
Alkaline Phosphatase: 62 U/L (ref 38–126)
Anion gap: 8 (ref 5–15)
BUN: 9 mg/dL (ref 6–20)
CO2: 26 mmol/L (ref 22–32)
Calcium: 9.1 mg/dL (ref 8.9–10.3)
Chloride: 102 mmol/L (ref 98–111)
Creatinine, Ser: 0.81 mg/dL (ref 0.44–1.00)
GFR, Estimated: >60 mL/min (ref >60)
Glucose, Bld: 93 mg/dL (ref 70–99)
Potassium: 4.2 mmol/L (ref 3.5–5.1)
Sodium: 136 mmol/L (ref 135–145)
Total Bilirubin: 0.4 mg/dL (ref 0.3–1.2)
Total Protein: 7.2 g/dL (ref 6.5–8.1)

## 2021-03-07 LAB — CBC
HCT: 37.8 % (ref 36.0–46.0)
Hemoglobin: 11.9 g/dL — ABNORMAL LOW (ref 12.0–15.0)
MCH: 27 pg (ref 26.0–34.0)
MCHC: 31.5 g/dL (ref 30.0–36.0)
MCV: 85.7 fL (ref 80.0–100.0)
Platelets: 250 10*3/uL (ref 150–400)
RBC: 4.41 MIL/uL (ref 3.87–5.11)
RDW: 13.4 % (ref 11.5–15.5)
WBC: 7.3 10*3/uL (ref 4.0–10.5)
nRBC: 0 % (ref 0.0–0.2)

## 2021-03-07 LAB — URINALYSIS, ROUTINE W REFLEX MICROSCOPIC
Bilirubin Urine: NEGATIVE
Glucose, UA: NEGATIVE mg/dL
Hgb urine dipstick: NEGATIVE
Ketones, ur: NEGATIVE mg/dL
Leukocytes,Ua: NEGATIVE
Nitrite: NEGATIVE
Protein, ur: NEGATIVE mg/dL
Specific Gravity, Urine: 1.005 (ref 1.005–1.030)
pH: 8 (ref 5.0–8.0)

## 2021-03-07 LAB — RESP PANEL BY RT-PCR (FLU A&B, COVID) ARPGX2
Influenza A by PCR: NEGATIVE
Influenza B by PCR: NEGATIVE
SARS Coronavirus 2 by RT PCR: NEGATIVE

## 2021-03-07 LAB — LIPASE, BLOOD: Lipase: 29 U/L (ref 11–51)

## 2021-03-07 LAB — PREGNANCY, URINE: Preg Test, Ur: NEGATIVE

## 2021-03-07 MED ORDER — FLUTICASONE PROPIONATE 50 MCG/ACT NA SUSP: 1.0000 | Freq: Every day | NASAL | 2 refills | Status: AC

## 2021-03-07 MED ORDER — IOHEXOL 300 MG/ML  SOLN
100.0000 mL | Freq: Once | INTRAMUSCULAR | Status: AC | PRN
  Administered 2021-03-07: 100 mL via INTRAVENOUS
  Filled 2021-03-07: qty 100

## 2021-03-07 MED ORDER — GUAIFENESIN 200 MG PO TABS: 400.0000 mg | ORAL_TABLET | ORAL | 0 refills | Status: DC | PRN

## 2021-03-07 MED ORDER — FLUTICASONE PROPIONATE 50 MCG/ACT NA SUSP: 1.0000 | Freq: Every day | NASAL | 2 refills | Status: DC

## 2021-03-07 MED ORDER — IBUPROFEN 600 MG PO TABS
600.0000 mg | ORAL_TABLET | Freq: Once | ORAL | Status: AC
  Administered 2021-03-07: 600 mg via ORAL
  Filled 2021-03-07: qty 1

## 2021-03-07 MED ORDER — PSEUDOEPHEDRINE HCL 60 MG PO TABS: 60.0000 mg | ORAL_TABLET | Freq: Four times a day (QID) | ORAL | 0 refills | Status: DC | PRN

## 2021-03-07 NOTE — Discharge Instructions (Addendum)
The prescribed medications as needed for your chest and nasal congestion.  You may continue to take ibuprofen for your abdominal pain.  Return to the ER for new, worsening, or persistent severe pain, difficulty breathing, fever, weakness, or any other new or worsening symptoms that concern you

## 2021-03-07 NOTE — ED Triage Notes (Signed)
Patient to ER via POV with complaints of RLQ pain that started on Monday. Denies NVD. Denies urinary symptoms. Reports hx of ovarian cysts. 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  Also reports x1 month and sinus drainage and chest congestion.

## 2021-03-07 NOTE — ED Provider Notes (Signed)
[facility] Provider Note    Event Date/Time   First MD Initiated Contact with Patient 03/07/21 1620     (approximate)   History   Abdominal Pain   HPI  Sierra Cummings is a 41 y.o. female with no active medical problems who presents with right lower quadrant abdominal pain over the last 2 days, persistent course, somewhat improved with ibuprofen, and not associated with nausea, vomiting, or diarrhea.  The patient denies any vaginal bleeding or abnormal discharge.  She has no dysuria or frequency.  She reports a prior history of ovarian cysts.  The patient also reports some sinus pressure, rhinorrhea, and chest congestion over the last few days.  She has a mild cough but no fever.  Physical Exam   Triage Vital Signs: ED Triage Vitals  Enc Vitals Group     BP 03/07/21 1536 (!) 109/97     Pulse Rate 03/07/21 1536 80     Resp 03/07/21 1536 16     Temp 03/07/21 1536 98.7 F (37.1 C)     Temp Source 03/07/21 1536 Oral     SpO2 03/07/21 1536 100 %     Weight 03/07/21 1537 145 lb (65.8 kg)     Height 03/07/21 1537 5\' 7"  (1.702 m)     Head Circumference --      Peak Flow --      Pain Score 03/07/21 1541 5     Pain Loc --      Pain Edu? --      Excl. in [place]? --     Most recent vital signs: Vitals:   03/07/21 1536  BP: (!) 109/97  Pulse: 80  Resp: 16  Temp: 98.7 F (37.1 C)  SpO2: 100%    General: Awake, no distress.  CV:  Good peripheral perfusion.  Resp:  Normal effort.  Lungs CTAB. Abd:  Soft with moderate right lower quadrant tenderness.  No distention. Other:  No scleral icterus or jaundice.  No flank tenderness.   ED Results / Procedures / Treatments   Labs (all labs ordered are listed, but only abnormal results are displayed) Labs Reviewed  CBC - Abnormal; Notable for the following components:      Result Value   Hemoglobin 11.9 (*)    All other components within normal limits  URINALYSIS, ROUTINE W REFLEX MICROSCOPIC - Abnormal;  Notable for the following components:   Color, Urine STRAW (*)    APPearance CLEAR (*)    All other components within normal limits  RESP PANEL BY RT-PCR (FLU A&B, COVID) ARPGX2  LIPASE, BLOOD  COMPREHENSIVE METABOLIC PANEL  PREGNANCY, URINE     EKG    RADIOLOGY  Chest x-ray: I independently viewed and interpreted the images; there is no focal consolidation or edema  CT abdomen/pelvis: I independently viewed and interpreted the images; no dilated bowel loops.  Normal appendix.  US pelvis: I independently viewed and interpreted the images; no large ovarian cysts or free fluid.  PROCEDURES:  Critical Care performed: No  Procedures   MEDICATIONS ORDERED IN ED: Medications  ibuprofen (ADVIL) tablet 600 mg (600 mg Oral Given 03/07/21 1747)  iohexol (OMNIPAQUE) 300 MG/ML solution 100 mL (100 mLs Intravenous Contrast Given 03/07/21 1919)     IMPRESSION / MDM / ASSESSMENT AND PLAN / ED COURSE  I reviewed the triage vital signs and the nursing notes.  41 year old female with no active medical problems presents with right lower quadrant abdominal pain over the last  2 days as well as some sinus pressure, chest congestion, and cough.  On exam she is overall well-appearing  For the abdominal pain, differential diagnosis includes, but is not limited to, appendicitis, colitis, diverticulitis, mesenteric adenitis, terminal ileitis, ovarian cyst rupture, UTI, ureteral stone.  There is no evidence of ovarian torsion.  The patient has no signs or symptoms to suggest PID or other infectious cause.  We will obtain a CT abdomen/pelvis; if this is nondiagnostic the patient may require an ultrasound.  The upper respiratory symptoms are most consistent with viral URI and possible sinusitis.  Differential includes influenza or COVID-19 or less likely pneumonia.  We will obtain a chest x-ray.  ----------------------------------------- 9:55 PM on  03/07/2021 -----------------------------------------  Chest x-ray is negative.  CT abdomen/pelvis showed a normal appendix, no other acute intra-abdominal findings, and uterine fibroids.  I proceeded with a pelvic ultrasound which confirmed fibroids but showed normal ovaries with no large cyst, free fluid, or evidence of torsion.  The patient is stable for discharge home.  I counseled her on the results of the work-up.  She has not required any pain medication besides ibuprofen.  I have prescribed Flonase, Sudafed, and guaifenesin for her upper respiratory and sinus symptoms.  Return precautions given, and she expresses understanding.    FINAL CLINICAL IMPRESSION(S) / ED DIAGNOSES   Final diagnoses:  Pelvic pain  Chest congestion     Rx / DC Orders   ED Discharge Orders          Ordered    guaiFENesin 200 MG tablet  Every 4 hours PRN,   Status:  Discontinued        03/07/21 2153    fluticasone (FLONASE) 50 MCG/ACT nasal spray  Daily,   Status:  Discontinued        03/07/21 2153    pseudoephedrine (SUDAFED) 60 MG tablet  Every 6 hours PRN,   Status:  Discontinued        03/07/21 2153    fluticasone (FLONASE) 50 MCG/ACT nasal spray  Daily        03/07/21 2153    guaiFENesin 200 MG tablet  Every 4 hours PRN        03/07/21 2153    pseudoephedrine (SUDAFED) 60 MG tablet  Every 6 hours PRN        03/07/21 2153             Note:  This document was prepared using Dragon voice recognition software and may include unintentional dictation errors.    Arta Silence, MD 03/07/21 2250

## 2022-07-20 IMAGING — CR DG CHEST 2V
1 series · 2 of 2 positions shown · non-contrast
Comparison: 03/28/2014

CLINICAL DATA: Shortness of breath, sinus drainage and chest
congestion for 1 month worsened in past 3 days

EXAM:
CHEST - 2 VIEW

[Series 1: dg chest 2 view · 0.14mm/px · 2 of 2 slices shown]
[im 1/2]
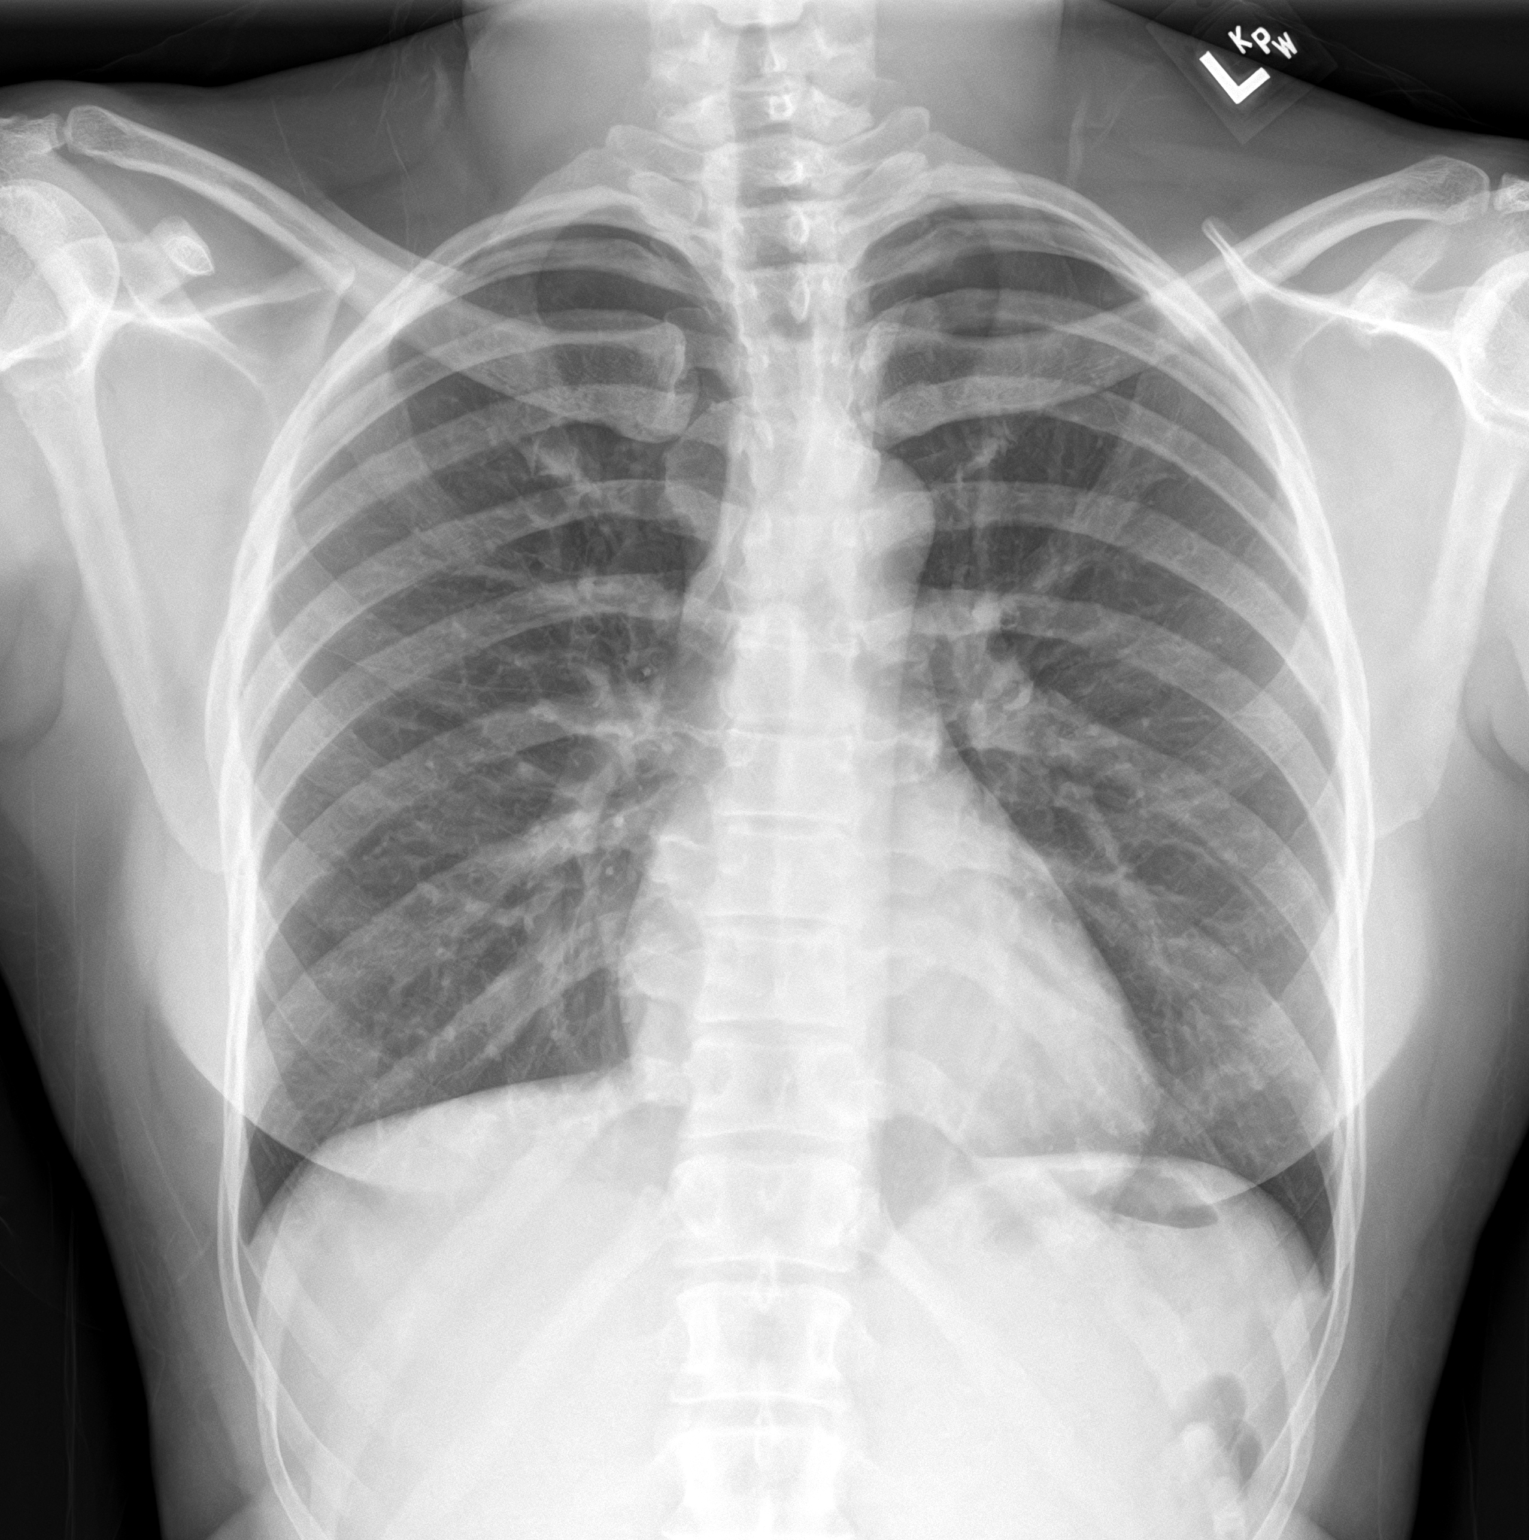
[im 2/2]
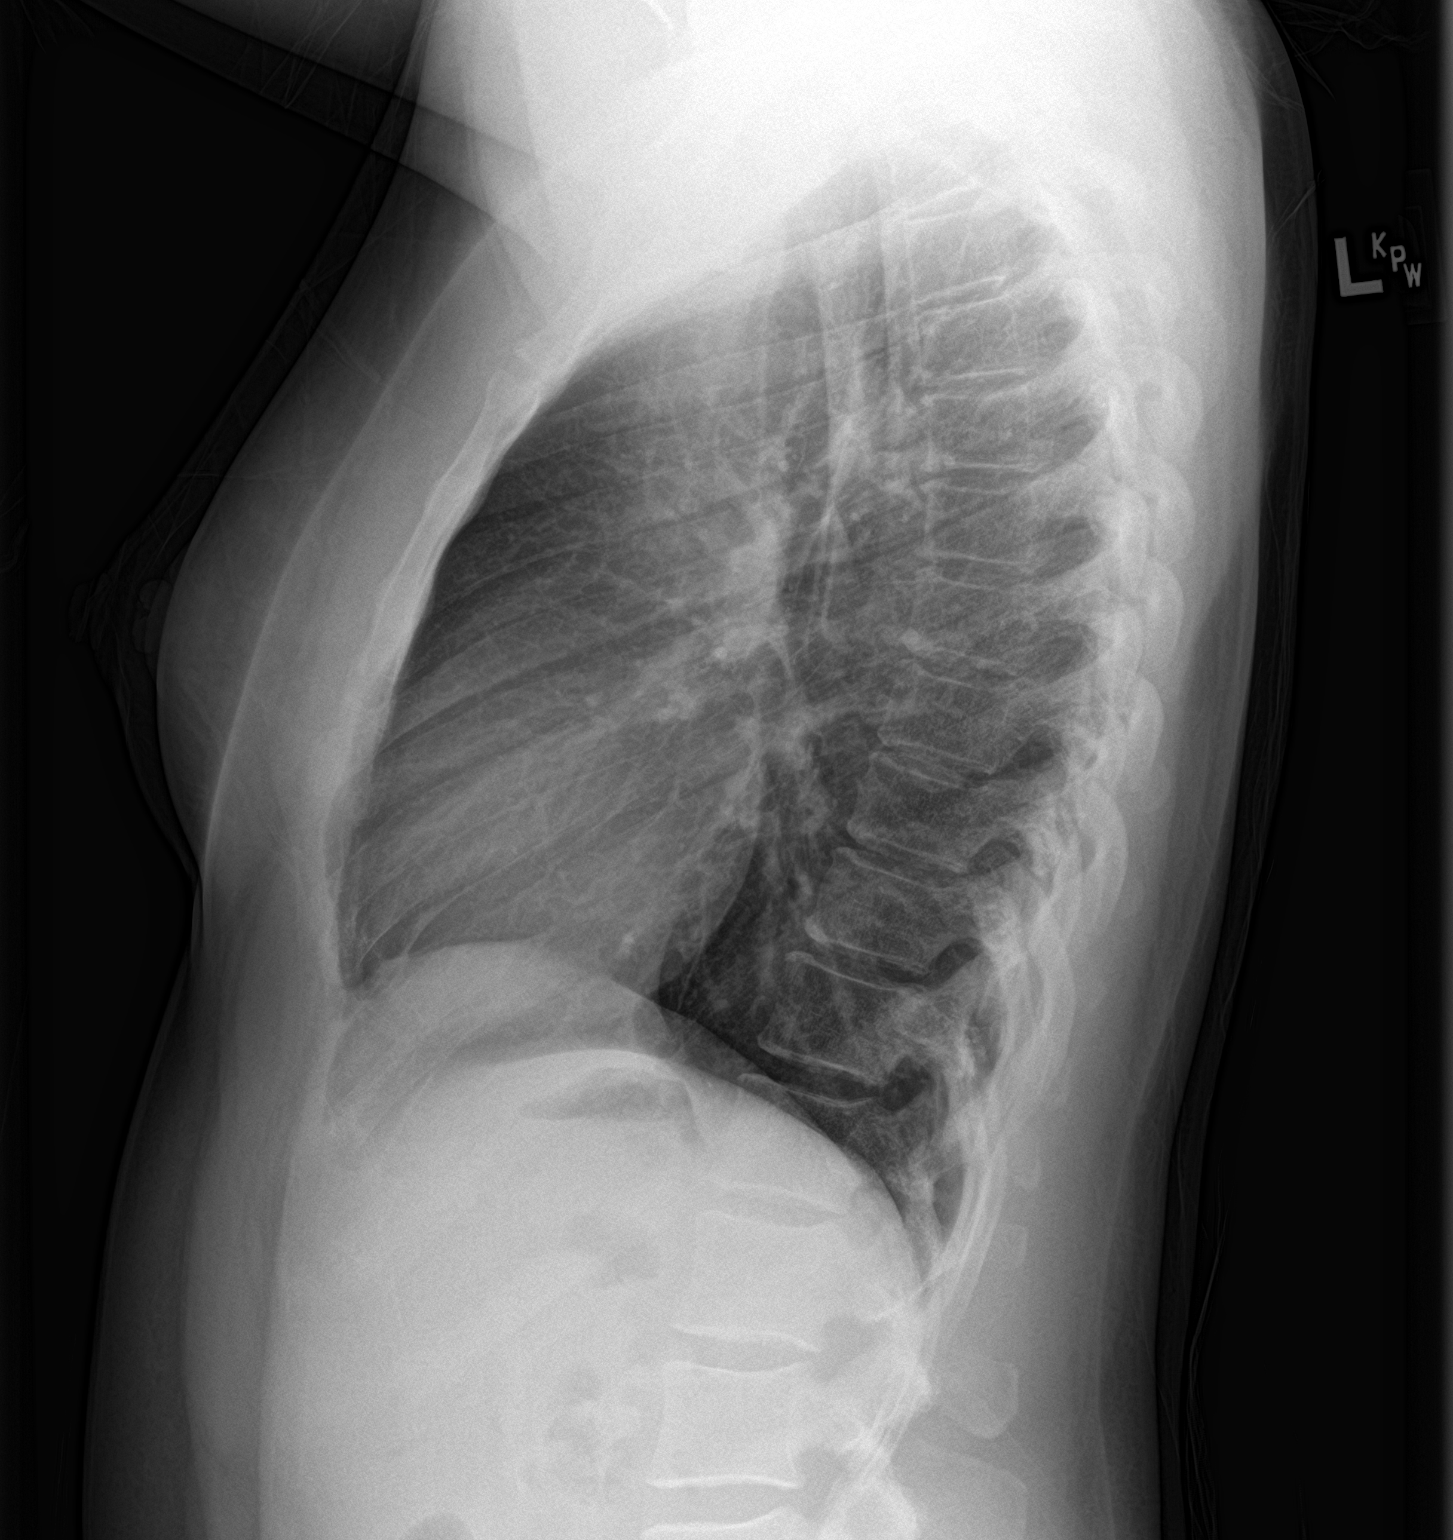

[2 of 2 positions shown; findings below may reference images not displayed]

FINDINGS: Normal heart size, mediastinal contours, and pulmonary vascularity.

Lungs clear.

No pleural effusion or pneumothorax.

Bones unremarkable.
IMPRESSION: Normal exam.

## 2022-07-23 ENCOUNTER — Ambulatory Visit (INDEPENDENT_AMBULATORY_CARE_PROVIDER_SITE_OTHER): Payer: BC Managed Care – PPO | Admitting: Internal Medicine

## 2022-07-23 ENCOUNTER — Encounter: Payer: Self-pay | Admitting: Internal Medicine

## 2022-07-23 VITALS — BP 120/70 | HR 82 | Temp 97.7°F | Resp 16 | Ht 65.0 in | Wt 157.0 lb

## 2022-07-23 DIAGNOSIS — R0602 Shortness of breath: Secondary | ICD-10-CM

## 2022-07-23 NOTE — Patient Instructions (Signed)
Shortness of Breath, Adult Shortness of breath means you have trouble breathing. Shortness of breath could be a sign of a medical problem. Follow these instructions at home:  Pollution Do not smoke or use any products that contain nicotine or tobacco. If you need help quitting, ask your doctor. Avoid things that can make it harder to breathe, such as: Smoke of all kinds. This includes smoke from campfires or forest fires. Do not smoke or allow others to smoke in your home. Mold. Dust. Air pollution. Chemical smells. Things that can give you an allergic reaction (allergens) if you have allergies. Keep your living space clean. Use products that help remove mold and dust. General instructions Watch for any changes in your symptoms. Take over-the-counter and prescription medicines only as told by your doctor. This includes oxygen therapy and inhaled medicines. Rest as needed. Return to your normal activities when your doctor says that it is safe. Keep all follow-up visits. Contact a doctor if: Your condition does not get better as soon as expected. You have a hard time doing your normal activities, even after you rest. You have new symptoms. You cannot walk up stairs. You cannot exercise the way you normally do. Get help right away if: Your shortness of breath gets worse. You have trouble breathing when you are resting. You feel light-headed or you faint. You have a cough that is not helped by medicines. You cough up blood. You have pain with breathing. You have pain in your chest, arms, shoulders, or belly (abdomen). You have a fever. These symptoms may be an emergency. Get help right away. Call 911. Do not wait to see if the symptoms will go away. Do not drive yourself to the hospital. Summary Shortness of breath is when you have trouble breathing enough air. It can be a sign of a medical problem. Avoid things that make it hard for you to breathe, such as smoking, pollution,  mold, and dust. Watch for any changes in your symptoms. Contact your doctor if you do not get better or you get worse. This information is not intended to replace advice given to you by your health care provider. Make sure you discuss any questions you have with your health care provider. Document Revised: 08/19/2020 Document Reviewed: 08/19/2020 Elsevier Patient Education  2024 Elsevier Inc.  

## 2022-07-23 NOTE — Progress Notes (Signed)
[facility] [address] [place], [place] [address]  Pulmonary Sleep Medicine   Office Visit Note  Patient Name: Sierra Cummings DOB: [birth year] MRN [id number]  Date of Service: 07/23/2022  Complaints/HPI: She has been having Shortness of breath for a couple years. She first noticed when she was training. She states she was getting winded with running. She has no cough. She has no chest pain or tightness. Denies fevers or chills. No exposure to MTB. She is from [place] this past April. She states she visits yearly. She does not smoke anything. CXR done last year was not too remarkable nothing done this year.   Office Spirometry Results:     ROS  General: (-) fever, (-) chills, (-) night sweats, (-) weakness Skin: (-) rashes, (-) itching,. Eyes: (-) visual changes, (-) redness, (-) itching. Nose and Sinuses: (-) nasal stuffiness or itchiness, (-) postnasal drip, (-) nosebleeds, (-) sinus trouble. Mouth and Throat: (-) sore throat, (-) hoarseness. Neck: (-) swollen glands, (-) enlarged thyroid, (-) neck pain. Respiratory: - cough, (-) bloody sputum, + shortness of breath, - wheezing. Cardiovascular: - ankle swelling, (-) chest pain. Lymphatic: (-) lymph node enlargement. Neurologic: (-) numbness, (-) tingling. Psychiatric: (-) anxiety, (-) depression   Current Medication: Outpatient Encounter Medications as of 07/23/2022  Medication Sig   naproxen (NAPROSYN) 500 MG tablet Take 1 tablet (500 mg total) by mouth 2 (two) times daily with a meal.   [DISCONTINUED] guaiFENesin 200 MG tablet Take 2 tablets (400 mg total) by mouth every 4 (four) hours as needed for cough or to loosen phlegm.   [DISCONTINUED] methocarbamol (ROBAXIN) 500 MG tablet Take 1 tablet (500 mg total) by mouth 4 (four) times daily.   [DISCONTINUED] pseudoephedrine (SUDAFED) 60 MG tablet Take 1 tablet (60 mg total) by mouth every 6 (six) hours as needed for congestion.   [DISCONTINUED] traMADol (ULTRAM) 50  MG tablet Take 1 tablet (50 mg total) by mouth every 6 (six) hours as needed.   fluticasone (FLONASE) 50 MCG/ACT nasal spray Place 1 spray into both nostrils daily for 5 days.   No facility-administered encounter medications on file as of 07/23/2022.    Surgical History: Past Surgical History:  Procedure Laterality Date   NO PAST SURGERIES      Medical History: Past Medical History:  Diagnosis Date   Medical history non-contributory     Family History: History reviewed. No pertinent family history.  Social History: Social History   Socioeconomic History   Marital status: Single    Spouse name: Not on file   Number of children: Not on file   Years of education: Not on file   Highest education level: Not on file  Occupational History   Not on file  Tobacco Use   Smoking status: Never   Smokeless tobacco: Never  Substance and Sexual Activity   Alcohol use: Yes   Drug use: No   Sexual activity: Yes    Birth control/protection: None  Other Topics Concern   Not on file  Social History Narrative   ** Merged History Encounter **       Social Determinants of Health   Financial Resource Strain: Not on file  Food Insecurity: Not on file  Transportation Needs: Not on file  Physical Activity: Not on file  Stress: Not on file  Social Connections: Not on file  Intimate Partner Violence: Not on file    Vital Signs: Blood pressure 120/70, pulse 82, temperature 97.7 F (36.5 C), resp. rate  16, height 5\' 5"  (1.651 m), weight 157 lb (71.2 kg), SpO2 99 %, unknown if currently breastfeeding.  Examination: General Appearance: The patient is well-developed, well-nourished, and in no distress. Skin: Gross inspection of skin unremarkable. Head: normocephalic, no gross deformities. Eyes: no gross deformities noted. ENT: ears appear grossly normal no exudates. Neck: Supple. No thyromegaly. No LAD. Respiratory: no rhonchi noted at this time. Cardiovascular: Normal S1 and S2  without murmur or rub. Extremities: No cyanosis. pulses are equal. Neurologic: Alert and oriented. No involuntary movements.  LABS: No results found for this or any previous visit (from the past 2160 hour(s)).  Radiology: US PELVIC COMPLETE W TRANSVAGINAL AND TORSION R/O  Result Date: 03/07/2021 CLINICAL DATA:  Pelvic pain. EXAM: TRANSABDOMINAL AND TRANSVAGINAL ULTRASOUND OF PELVIS DOPPLER ULTRASOUND OF OVARIES TECHNIQUE: Both transabdominal and transvaginal ultrasound examinations of the pelvis were performed. Transabdominal technique was performed for global imaging of the pelvis including uterus, ovaries, adnexal regions, and pelvic cul-de-sac. It was necessary to proceed with endovaginal exam following the transabdominal exam to visualize the ovaries and endometrium. Color and duplex Doppler ultrasound was utilized to evaluate blood flow to the ovaries. COMPARISON:  CT scan, same date. FINDINGS: Uterus Measurements: 9.1 x 5.9 x 6.3 cm = volume: 178.7 mL. There is a 5.3 x 3.8 cm fibroid associated with the posterior myometrium compressing the endometrium. This correlates with the CT scan. Endometrium Thickness: 3.9 mm.  No focal abnormality visualized. Right ovary Measurements: 3.5 x 2.7 x 2.2 cm = volume: 10.4 mL. Simple right ovarian follicle measuring 20 x 18 x 17 mm. Left ovary Measurements: 2.3 x 1.4 x 1.8 cm = volume: 3.1 mL. Normal appearance/no adnexal mass. Pulsed Doppler evaluation of both ovaries demonstrates normal low-resistance arterial and venous waveforms. Other findings No abnormal free fluid. IMPRESSION: 1. 5 cm uterine fibroid. 2. Normal ovaries. Electronically Signed   By: Rudie Meyer M.D.   On: 03/07/2021 21:32   CT ABDOMEN PELVIS W CONTRAST  Result Date: 03/07/2021 CLINICAL DATA:  Right lower quadrant EXAM: CT ABDOMEN AND PELVIS WITH CONTRAST TECHNIQUE: Multidetector CT imaging of the abdomen and pelvis was performed using the standard protocol following bolus administration  of intravenous contrast. RADIATION DOSE REDUCTION: This exam was performed according to the departmental dose-optimization program which includes automated exposure control, adjustment of the mA and/or kV according to patient size and/or use of iterative reconstruction technique. CONTRAST:  OMNIPAQUE IOHEXOL 300 MG/ML  SOLN COMPARISON:  None. FINDINGS: Lower chest: No acute abnormality. Hepatobiliary: Contracted gallbladder. No calcified stones. No biliary dilatation Pancreas: Unremarkable. No pancreatic ductal dilatation or surrounding inflammatory changes. Spleen: Normal in size without focal abnormality. Adrenals/Urinary Tract: Adrenal glands are unremarkable. Kidneys are normal, without renal calculi, focal lesion, or hydronephrosis. Bladder is unremarkable. Stomach/Bowel: Stomach is within normal limits. Appendix appears normal. No evidence of bowel wall thickening, distention, or inflammatory changes. Vascular/Lymphatic: No significant vascular findings are present. No enlarged abdominal or pelvic lymph nodes. Reproductive: Fundus enlargement with heterogenous enhancing mass in the posterior uterine corpus and fundus, this measures 4.6 by 4.4 cm and may represent a large submucosal fibroid. No adnexal mass. Other: Negative for pelvic effusion or free air. Musculoskeletal: No acute or significant osseous findings. IMPRESSION: 1. No CT evidence for acute intra-abdominal or pelvic abnormality. Negative for acute appendicitis 2. Probable uterine fibroids. Electronically Signed   By: Jasmine Pang M.D.   On: 03/07/2021 19:37   DG Chest 2 View  Result Date: 03/07/2021 CLINICAL DATA:  Shortness of  breath, sinus drainage and chest congestion for 1 month worsened in past 3 days EXAM: CHEST - 2 VIEW COMPARISON:  03/28/2014 FINDINGS: Normal heart size, mediastinal contours, and pulmonary vascularity. Lungs clear. No pleural effusion or pneumothorax. Bones unremarkable. IMPRESSION: Normal exam. Electronically  Signed   By: Ulyses Southward M.D.   On: 03/07/2021 18:34    No results found.  No results found.  Assessment and Plan: Patient Active Problem List   Diagnosis Date Noted   Vaginal bleeding during pregnancy, antepartum 02/11/2015    1. Shortness of breath Will start with basic workup including pulmonary functions also get a CT she is from a high endemic area of tuberculosis so therefore, you need to evaluate her films.  I also recommended getting a's level for possibility of sarcoidosis.  In addition to that we will get routine lab work and she will follow-up back in the office. - CT Chest High Resolution; Future - Pulmonary function test; Future   General Counseling: I have discussed the findings of the evaluation and examination with Symphonie.  I have also discussed any further diagnostic evaluation thatmay be needed or ordered today. Keisa verbalizes understanding of the findings of todays visit. We also reviewed her medications today and discussed drug interactions and side effects including but not limited excessive drowsiness and altered mental states. We also discussed that there is always a risk not just to her but also people around her. she has been encouraged to call the office with any questions or concerns that should arise related to todays visit.  Orders Placed This Encounter  Procedures   CT Chest High Resolution    Standing Status:   Future    Standing Expiration Date:   07/23/2023    Order Specific Question:   Is patient pregnant?    Answer:   No    Order Specific Question:   Preferred imaging location?    Answer:   [facility]   CBC with Differential/Platelet   Angiotensin converting enzyme   Comprehensive Metabolic Panel (CMET)   Pulmonary function test    Standing Status:   Future    Standing Expiration Date:   07/23/2023    Order Specific Question:   Where should this test be performed?    Answer:   [facility]     Time spent: 64  I have  personally obtained a history, examined the patient, evaluated laboratory and imaging results, formulated the assessment and plan and placed orders.    Yevonne Pax, MD [facility] Pulmonary and Critical Care Sleep medicine

## 2022-07-24 ENCOUNTER — Telehealth: Payer: Self-pay | Admitting: Internal Medicine

## 2022-07-24 NOTE — Telephone Encounter (Signed)
Chest CT order faxed to [facility] per insurance approval-Toni

## 2022-08-21 ENCOUNTER — Ambulatory Visit (INDEPENDENT_AMBULATORY_CARE_PROVIDER_SITE_OTHER): Payer: BC Managed Care – PPO | Admitting: Internal Medicine

## 2022-08-21 DIAGNOSIS — R0602 Shortness of breath: Secondary | ICD-10-CM

## 2022-08-29 NOTE — Procedures (Signed)
[facility] [address] [place], [address]    Complete Pulmonary Function Testing Interpretation:  FINDINGS:  The forced vital capacity is mildly decreased.  The FEV1 is 1.75 L which is 65% of predicted and is mildly decreased.  Postbronchodilator there was no significant change in the FEV1.  FEV1 FVC ratio is mildly decreased.  Total lung capacity was mildly decreased.  Residual volume is increased residual volume total lung capacity ratio is increased.  FRC is decreased.  The DLCO was mildly decreased.  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  It should be noted that the technique was not optimal and should be taken into consideration the clinical correlation.  Yevonne Pax, MD [facility] Pulmonary Critical Care Medicine Sleep Medicine

## 2022-09-03 ENCOUNTER — Encounter: Payer: Self-pay | Admitting: Internal Medicine

## 2022-09-03 ENCOUNTER — Ambulatory Visit (INDEPENDENT_AMBULATORY_CARE_PROVIDER_SITE_OTHER): Payer: BC Managed Care – PPO | Admitting: Internal Medicine

## 2022-09-03 VITALS — BP 124/76 | HR 84 | Temp 98.4°F | Resp 16 | Ht 65.0 in | Wt 158.0 lb

## 2022-09-03 DIAGNOSIS — J4520 Mild intermittent asthma, uncomplicated: Secondary | ICD-10-CM

## 2022-09-03 DIAGNOSIS — R0602 Shortness of breath: Secondary | ICD-10-CM | POA: Diagnosis not present

## 2022-09-03 DIAGNOSIS — J45990 Exercise induced bronchospasm: Secondary | ICD-10-CM | POA: Diagnosis not present

## 2022-09-03 MED ORDER — FLUTICASONE-SALMETEROL 100-50 MCG/ACT IN AEPB: 1.0000 | INHALATION_SPRAY | Freq: Two times a day (BID) | RESPIRATORY_TRACT | 3 refills | Status: AC

## 2022-09-03 MED ORDER — ALBUTEROL SULFATE HFA 108 (90 BASE) MCG/ACT IN AERS: 2.0000 | INHALATION_SPRAY | Freq: Four times a day (QID) | RESPIRATORY_TRACT | 0 refills | Status: DC | PRN

## 2022-09-03 NOTE — Progress Notes (Signed)
[facility] [address] [place], [place] [address]  Pulmonary Sleep Medicine   Office Visit Note  Patient Name: Sierra Cummings DOB: 07-10-1980 MRN [id number]  Date of Service: 09/03/2022  Complaints/HPI: No ILD. She does have a reduced FEV1 noted on the PFT. 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 She does benefit from using albuterol. As far as her breathing is concerned. I will give a script for this and also will place her on advair  Office Spirometry Results:     ROS  General: (-) fever, (-) chills, (-) night sweats, (-) weakness Skin: (-) rashes, (-) itching,. Eyes: (-) visual changes, (-) redness, (-) itching. Nose and Sinuses: (-) nasal stuffiness or itchiness, (-) postnasal drip, (-) nosebleeds, (-) sinus trouble. Mouth and Throat: (-) sore throat, (-) hoarseness. Neck: (-) swollen glands, (-) enlarged thyroid, (-) neck pain. Respiratory: - cough, (-) bloody sputum, + shortness of breath, - wheezing. Cardiovascular: - ankle swelling, (-) chest pain. Lymphatic: (-) lymph node enlargement. Neurologic: (-) numbness, (-) tingling. Psychiatric: (-) anxiety, (-) depression   Current Medication: Outpatient Encounter Medications as of 09/03/2022  Medication Sig   naproxen (NAPROSYN) 500 MG tablet Take 1 tablet (500 mg total) by mouth 2 (two) times daily with a meal.   fluticasone (FLONASE) 50 MCG/ACT nasal spray Place 1 spray into both nostrils daily for 5 days.   No facility-administered encounter medications on file as of 09/03/2022.    Surgical History: Past Surgical History:  Procedure Laterality Date   NO PAST SURGERIES      Medical History: Past Medical History:  Diagnosis Date   Medical history non-contributory     Family History: History reviewed. No pertinent family history.  Social History: Social History   Socioeconomic History   Marital status: Single    Spouse name: Not on file   Number of children: Not on file   Years of  education: Not on file   Highest education level: Not on file  Occupational History   Not on file  Tobacco Use   Smoking status: Never   Smokeless tobacco: Never  Substance and Sexual Activity   Alcohol use: Yes   Drug use: No   Sexual activity: Yes    Birth control/protection: None  Other Topics Concern   Not on file  Social History Narrative   ** Merged History Encounter **       Social Determinants of Health   Financial Resource Strain: Low Risk  (06/18/2022)   Received from [facility], [facility]   Overall Financial Resource Strain (CARDIA)    Difficulty of Paying Living Expenses: Not hard at all  Food Insecurity: No Food Insecurity (06/18/2022)   Received from [facility], [facility]   Hunger Vital Sign    Worried About Running Out of Food in the Last Year: Never true    Ran Out of Food in the Last Year: Never true  Transportation Needs: No Transportation Needs (06/18/2022)   Received from [facility], [facility]   [facility] - Transportation    In the past 12 months, has lack of transportation kept you from medical appointments or from getting medications?: No    Lack of Transportation (Non-Medical): No  Physical Activity: Not on file  Stress: Not on file  Social Connections: Not on file  Intimate Partner Violence: Not on file    Vital Signs: Blood pressure 124/76, pulse 84, temperature 98.4 F (36.9  C), resp. rate 16, height 5\' 5"  (1.651 m), weight 158 lb (71.7 kg), SpO2 99%, unknown if currently breastfeeding.  Examination: General Appearance: The patient is well-developed, well-nourished, and in no distress. Skin: Gross inspection of skin unremarkable. Head: normocephalic, no gross deformities. Eyes: no gross deformities noted. ENT: ears appear grossly normal no exudates. Neck: Supple. No thyromegaly. No LAD. Respiratory: no rhochi noted. Cardiovascular:  Normal S1 and S2 without murmur or rub. Extremities: No cyanosis. pulses are equal. Neurologic: Alert and oriented. No involuntary movements.  LABS: Recent Results (from the past 2160 hour(s))  Pulmonary function test     Status: None   Collection Time: 08/21/22  9:00 AM  Result Value Ref Range   FEV1     FVC     FEV1/FVC     TLC     DLCO      Radiology: US PELVIC COMPLETE W TRANSVAGINAL AND TORSION R/O  Result Date: 03/07/2021 CLINICAL DATA:  Pelvic pain. EXAM: TRANSABDOMINAL AND TRANSVAGINAL ULTRASOUND OF PELVIS DOPPLER ULTRASOUND OF OVARIES TECHNIQUE: Both transabdominal and transvaginal ultrasound examinations of the pelvis were performed. Transabdominal technique was performed for global imaging of the pelvis including uterus, ovaries, adnexal regions, and pelvic cul-de-sac. It was necessary to proceed with endovaginal exam following the transabdominal exam to visualize the ovaries and endometrium. Color and duplex Doppler ultrasound was utilized to evaluate blood flow to the ovaries. COMPARISON:  CT scan, same date. FINDINGS: Uterus Measurements: 9.1 x 5.9 x 6.3 cm = volume: 178.7 mL. There is a 5.3 x 3.8 cm fibroid associated with the posterior myometrium compressing the endometrium. This correlates with the CT scan. Endometrium Thickness: 3.9 mm.  No focal abnormality visualized. Right ovary Measurements: 3.5 x 2.7 x 2.2 cm = volume: 10.4 mL. Simple right ovarian follicle measuring 20 x 18 x 17 mm. Left ovary Measurements: 2.3 x 1.4 x 1.8 cm = volume: 3.1 mL. Normal appearance/no adnexal mass. Pulsed Doppler evaluation of both ovaries demonstrates normal low-resistance arterial and venous waveforms. Other findings No abnormal free fluid. IMPRESSION: 1. 5 cm uterine fibroid. 2. Normal ovaries. Electronically Signed   By: Rudie Meyer M.D.   On: 03/07/2021 21:32   CT ABDOMEN PELVIS W CONTRAST  Result Date: 03/07/2021 CLINICAL DATA:  Right lower quadrant EXAM: CT ABDOMEN AND PELVIS WITH  CONTRAST TECHNIQUE: Multidetector CT imaging of the abdomen and pelvis was performed using the standard protocol following bolus administration of intravenous contrast. RADIATION DOSE REDUCTION: This exam was performed according to the departmental dose-optimization program which includes automated exposure control, adjustment of the mA and/or kV according to patient size and/or use of iterative reconstruction technique. CONTRAST:  OMNIPAQUE IOHEXOL 300 MG/ML  SOLN COMPARISON:  None. FINDINGS: Lower chest: No acute abnormality. Hepatobiliary: Contracted gallbladder. No calcified stones. No biliary dilatation Pancreas: Unremarkable. No pancreatic ductal dilatation or surrounding inflammatory changes. Spleen: Normal in size without focal abnormality. Adrenals/Urinary Tract: Adrenal glands are unremarkable. Kidneys are normal, without renal calculi, focal lesion, or hydronephrosis. Bladder is unremarkable. Stomach/Bowel: Stomach is within normal limits. Appendix appears normal. No evidence of bowel wall thickening, distention, or inflammatory changes. Vascular/Lymphatic: No significant vascular findings are present. No enlarged abdominal or pelvic lymph nodes. Reproductive: Fundus enlargement with heterogenous enhancing mass in the posterior uterine corpus and fundus, this measures 4.6 by 4.4 cm and may represent a large submucosal fibroid. No adnexal mass. Other: Negative for pelvic effusion or free air. Musculoskeletal: No acute or significant osseous findings. IMPRESSION: 1. No CT evidence for  acute intra-abdominal or pelvic abnormality. Negative for acute appendicitis 2. Probable uterine fibroids. Electronically Signed   By: Jasmine Pang M.D.   On: 03/07/2021 19:37   DG Chest 2 View  Result Date: 03/07/2021 CLINICAL DATA:  Shortness of breath, sinus drainage and chest congestion for 1 month worsened in past 3 days EXAM: CHEST - 2 VIEW COMPARISON:  03/28/2014 FINDINGS: Normal heart size, mediastinal  contours, and pulmonary vascularity. Lungs clear. No pleural effusion or pneumothorax. Bones unremarkable. IMPRESSION: Normal exam. Electronically Signed   By: Ulyses Southward M.D.   On: 03/07/2021 18:34    No results found.  No results found.  Assessment and Plan: Patient Active Problem List   Diagnosis Date Noted   Vaginal bleeding during pregnancy, antepartum 02/11/2015     1. Chronic asthma, mild intermittent, uncomplicated Will give her prescription for Advair Ventolin to use.  On the scan there is no interstitial lung disease noted. - fluticasone-salmeterol (ADVAIR) 100-50 MCG/ACT AEPB; Inhale 1 puff into the lungs 2 (two) times daily.  Dispense: 1 each; Refill: 3 - albuterol (VENTOLIN HFA) 108 (90 Base) MCG/ACT inhaler; Inhale 2 puffs into the lungs every 6 (six) hours as needed for wheezing or shortness of breath.  Dispense: 8 g; Refill: 0  2. Shortness of breath Her CT chest does not show any intrinsic lung disease. Some old scarring is noted however she does have a reduced FEV1 noted on the PFT  3. Exercise-induced asthma Use inhalers prior to starting any type of exercise - fluticasone-salmeterol (ADVAIR) 100-50 MCG/ACT AEPB; Inhale 1 puff into the lungs 2 (two) times daily.  Dispense: 1 each; Refill: 3 - albuterol (VENTOLIN HFA) 108 (90 Base) MCG/ACT inhaler; Inhale 2 puffs into the lungs every 6 (six) hours as needed for wheezing or shortness of breath.  Dispense: 8 g; Refill: 0   General Counseling: I have discussed the findings of the evaluation and examination with Eshika.  I have also discussed any further diagnostic evaluation thatmay be needed or ordered today. Nadege verbalizes understanding of the findings of todays visit. We also reviewed her medications today and discussed drug interactions and side effects including but not limited excessive drowsiness and altered mental states. We also discussed that there is always a risk not just to her but also people around her.  she has been encouraged to call the office with any questions or concerns that should arise related to todays visit.  No orders of the defined types were placed in this encounter.    Time spent: 60  I have personally obtained a history, examined the patient, evaluated laboratory and imaging results, formulated the assessment and plan and placed orders.    Yevonne Pax, MD [facility] Pulmonary and Critical Care Sleep medicine

## 2022-09-04 ENCOUNTER — Telehealth: Payer: Self-pay | Admitting: Internal Medicine

## 2022-09-04 NOTE — Telephone Encounter (Signed)
Awaiting 09/03/22 office notes for Allergy Testing referral-Toni

## 2022-09-23 ENCOUNTER — Telehealth: Payer: Self-pay | Admitting: Internal Medicine

## 2022-09-23 NOTE — Telephone Encounter (Signed)
Allergy Testing referral faxed to [facility]; [phone]. Notified patient. Gave pt telephone # [phone]

## 2022-10-02 ENCOUNTER — Other Ambulatory Visit: Payer: Self-pay | Admitting: Internal Medicine

## 2022-10-02 DIAGNOSIS — J4520 Mild intermittent asthma, uncomplicated: Secondary | ICD-10-CM

## 2022-10-02 DIAGNOSIS — J45990 Exercise induced bronchospasm: Secondary | ICD-10-CM

## 2022-11-18 ENCOUNTER — Other Ambulatory Visit: Payer: Self-pay | Admitting: Obstetrics

## 2022-11-18 DIAGNOSIS — Z1231 Encounter for screening mammogram for malignant neoplasm of breast: Secondary | ICD-10-CM

## 2022-12-04 ENCOUNTER — Ambulatory Visit
Admission: RE | Admit: 2022-12-04 | Discharge: 2022-12-04 | Disposition: A | Discharge status: Home / Self Care | Source: Ambulatory Visit | Attending: Obstetrics | Admitting: Obstetrics

## 2022-12-04 DIAGNOSIS — Z1231 Encounter for screening mammogram for malignant neoplasm of breast: Secondary | ICD-10-CM | POA: Diagnosis present

## 2022-12-17 ENCOUNTER — Other Ambulatory Visit: Payer: Self-pay | Admitting: Obstetrics and Gynecology

## 2022-12-17 DIAGNOSIS — R928 Other abnormal and inconclusive findings on diagnostic imaging of breast: Secondary | ICD-10-CM

## 2022-12-18 ENCOUNTER — Ambulatory Visit
Admission: RE | Admit: 2022-12-18 | Discharge: 2022-12-18 | Disposition: A | Discharge status: Home / Self Care | Source: Ambulatory Visit | Attending: Obstetrics and Gynecology | Admitting: Obstetrics and Gynecology

## 2022-12-18 DIAGNOSIS — R928 Other abnormal and inconclusive findings on diagnostic imaging of breast: Secondary | ICD-10-CM

## 2023-03-04 ENCOUNTER — Ambulatory Visit: Admitting: Internal Medicine

## 2023-08-20 ENCOUNTER — Other Ambulatory Visit: Payer: Self-pay

## 2023-08-20 DIAGNOSIS — R0602 Shortness of breath: Secondary | ICD-10-CM

## 2023-09-03 ENCOUNTER — Encounter: Admitting: Internal Medicine

## 2023-12-22 ENCOUNTER — Other Ambulatory Visit: Payer: Self-pay | Admitting: Family Medicine

## 2023-12-22 DIAGNOSIS — Z1231 Encounter for screening mammogram for malignant neoplasm of breast: Secondary | ICD-10-CM

## 2024-01-05 ENCOUNTER — Ambulatory Visit
Admission: RE | Admit: 2024-01-05 | Discharge: 2024-01-05 | Disposition: A | Discharge status: Home / Self Care | Source: Ambulatory Visit | Attending: Family Medicine | Admitting: Family Medicine

## 2024-01-05 DIAGNOSIS — Z1231 Encounter for screening mammogram for malignant neoplasm of breast: Secondary | ICD-10-CM | POA: Diagnosis present
